# Patient Record
Sex: Female | Born: 1995 | Race: Black or African American | Hispanic: No | Marital: Single | Smoking: Never smoker
Health system: Southern US, Community
[De-identification: ages and names within clinical notes are randomized; demographics above are authoritative.]

## PROBLEM LIST (undated history)

## (undated) DIAGNOSIS — D649 Anemia, unspecified: Secondary | ICD-10-CM

## (undated) DIAGNOSIS — Z87440 Personal history of urinary (tract) infections: Secondary | ICD-10-CM

## (undated) HISTORY — PX: DILATION AND CURETTAGE OF UTERUS: SHX78

## (undated) HISTORY — DX: Personal history of urinary (tract) infections: Z87.440

---
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
## 2013-10-05 ENCOUNTER — Emergency Department (INDEPENDENT_AMBULATORY_CARE_PROVIDER_SITE_OTHER)

## 2013-10-05 ENCOUNTER — Encounter (HOSPITAL_COMMUNITY): Payer: Self-pay | Admitting: Emergency Medicine

## 2013-10-05 ENCOUNTER — Emergency Department (INDEPENDENT_AMBULATORY_CARE_PROVIDER_SITE_OTHER)
Admission: EM | Admit: 2013-10-05 | Discharge: 2013-10-05 | Disposition: A | Discharge status: Home / Self Care | Source: Home / Self Care | Attending: Emergency Medicine | Admitting: Emergency Medicine

## 2013-10-05 DIAGNOSIS — T189XXA Foreign body of alimentary tract, part unspecified, initial encounter: Secondary | ICD-10-CM

## 2013-10-05 NOTE — ED Notes (Signed)
Chart review.

## 2013-10-05 NOTE — ED Provider Notes (Signed)
Chief Complaint:   Chief Complaint  Patient presents with  . Swallowed Foreign Body    History of Present Illness:    Joy White is a 17 year old female who swallowed a tongue stud about an hour before presentation here. She has 2 tongue studs, one on either side of the tongue. The screw came loose while she was eating and she inadvertently swallowed the stud. She replaced it with another stud right away to prevent a hole from closing up. She feels like the stud is lodged in her throat. She is able to swallow saliva, talk, breathe without any difficulty. She denies any abdominal pain, nausea, vomiting, or evidence of GI bleeding.  Review of Systems:  Other than noted above, the patient denies any of the following symptoms: Constitutional:  No fever, chills, fatigue, weight loss or anorexia. Lungs:  No cough or shortness of breath. Heart:  No chest pain, palpitations, syncope or edema.  No cardiac history. Abdomen:  No nausea, vomiting, hematememesis, melena, diarrhea, or hematochezia. GU:  No dysuria, frequency, urgency, or hematuria. Gyn:  No vaginal discharge, itching, abnormal bleeding, dyspareunia, or pelvic pain.  PMFSH:  Past medical history, family history, social history, meds, and allergies were reviewed along with nurse's notes.  No prior abdominal surgeries, past history of GI problems, STDs or GYN problems.  No history of aspirin or NSAID use.  No excessive alcohol intake.  Physical Exam:   Vital signs:  BP 127/80  Pulse 72  Temp(Src) 98.9 F (37.2 C) (Oral)  Resp 16  SpO2 100%  LMP 09/18/2013 Gen:  Alert, oriented, in no distress. ENT: She has 2 tongue studs in place. The pharynx is clear. There is no cervical tenderness. Lungs:  Breath sounds clear and equal bilaterally.  No wheezes, rales or rhonchi. Heart:  Regular rhythm.  No gallops or murmers.   Abdomen:  Soft, nontender, no organomegaly or mass, bowel sounds are normal. Skin:  Clear, warm and dry.  No  rash.  Labs:  No results found for this or any previous visit.   Radiology:  Dg Neck Soft Tissue  10/05/2013   ADDENDUM REPORT: 10/05/2013 18:36  ADDENDUM: Voice Recognition error, CLINICAL DATA should read "Personal lingual adornment"   Electronically Signed   By: Malachy Moan M.D.   On: 10/05/2013 18:36   10/05/2013   CLINICAL DATA:  Swallowed a personal lingual and alignment  EXAM: NECK SOFT TISSUES - 1+ VIEW  COMPARISON:  Concurrently obtained a chest x-ray  FINDINGS: Frontal and lateral views of the soft tissues of the neck demonstrate no radiopaque foreign body. The epiglottis is well-defined. No prevertebral soft tissue swelling. The visualized osseous structures are intact and unremarkable. The upper chest is unremarkable.  IMPRESSION: Negative.  Electronically Signed: By: Malachy Moan M.D. On: 10/05/2013 17:50   Dg Chest 2 View  10/05/2013   CLINICAL DATA:  Swallowed tongue stud 1 hr ago  EXAM: CHEST  2 VIEW  COMPARISON:  None.  FINDINGS: The heart size and mediastinal contours are within normal limits. Both lungs are clear. The visualized skeletal structures are unremarkable. There is a metallic foreign body identified along the inferior margin of the PA radiograph. This may represent ingested tongue stud or could be other body piercing.  IMPRESSION: 1.  No acute cardiopulmonary abnormalities.  2. Metallic density along the inferior margin of the radiograph which may represent the ingested foreign body or could represent other body piercing.   Electronically Signed   By: Signa Kell  M.D.   On: 10/05/2013 17:20   Dg Abd 1 View  10/05/2013   CLINICAL DATA:  Swallowed a tongue piercing  EXAM: ABDOMEN - 1 VIEW  COMPARISON:  None.  FINDINGS: Radiopaque foreign body is noted at the superior aspect of film consistent with the ingested foreign body. No other focal abnormality is seen.  IMPRESSION: Ingested foreign body within the stomach.   Electronically Signed   By: Alcide Clever M.D.    On: 10/05/2013 18:32     Assessment:  The encounter diagnosis was Swallowed foreign body, initial encounter.  The foreign body is now in the stomach and should pass on its own since it is very small. She should be on the lookout for any signs of bleeding or obstruction such as pain, nausea, vomiting, or any evidence of GI bleeding.  Plan:   1.  Meds:  The following meds were prescribed:  There are no discharge medications for this patient.   2.  Patient Education/Counseling:  The patient was given appropriate handouts, self care instructions, and instructed in symptomatic relief.    3.  Follow up:  The patient was told to follow up if no better in 3 to 4 days, if becoming worse in any way, and given some red flag symptoms such as abdominal pain, vomiting, blood in the stool, or melena which would prompt immediate return.  Follow up  At the emergency room if needed.    Reuben Likes, MD 10/05/13 2223

## 2014-10-28 ENCOUNTER — Ambulatory Visit (INDEPENDENT_AMBULATORY_CARE_PROVIDER_SITE_OTHER): Admitting: Family Medicine

## 2014-10-28 VITALS — BP 120/60 | HR 118 | Temp 98.9°F | Resp 16 | Ht 65.0 in | Wt 147.4 lb

## 2014-10-28 DIAGNOSIS — L0390 Cellulitis, unspecified: Secondary | ICD-10-CM

## 2014-10-28 DIAGNOSIS — Z131 Encounter for screening for diabetes mellitus: Secondary | ICD-10-CM

## 2014-10-28 DIAGNOSIS — L0291 Cutaneous abscess, unspecified: Secondary | ICD-10-CM

## 2014-10-28 LAB — POCT GLYCOSYLATED HEMOGLOBIN (HGB A1C): Hemoglobin A1C: 4.9

## 2014-10-28 MED ORDER — DOXYCYCLINE HYCLATE 100 MG PO TABS: 100.0000 mg | ORAL_TABLET | Freq: Two times a day (BID) | ORAL | Status: DC

## 2014-10-28 MED ORDER — HYDROCODONE-ACETAMINOPHEN 5-325 MG PO TABS: 1.0000 | ORAL_TABLET | Freq: Four times a day (QID) | ORAL | Status: DC | PRN

## 2014-10-28 NOTE — Patient Instructions (Addendum)
WOUND CARE Please return in 2 days to have your Packing and wound rechecked . Keep area clean and dry for 24 hours. Do not remove bandage, if applied. . After 24 hours, remove bandage and wash wound gently with mild soap and warm water. Reapply a new bandage after cleaning wound, if directed. . Continue daily cleansing with soap and water until stitches/staples are removed. . Do not apply any ointments or creams to the wound while stitches/staples are in place, as this may cause delayed healing. . Notify the office if you experience any of the following signs of infection: Swelling, redness, pus drainage, streaking, fever >101.0 F . Notify the office if you experience excessive bleeding that does not stop after 15-20 minutes of constant, firm pressure.      Abscess An abscess is an infected area that contains a collection of pus and debris.It can occur in almost any part of the body. An abscess is also known as a furuncle or boil. CAUSES  An abscess occurs when tissue gets infected. This can occur from blockage of oil or sweat glands, infection of hair follicles, or a minor injury to the skin. As the body tries to fight the infection, pus collects in the area and creates pressure under the skin. This pressure causes pain. People with weakened immune systems have difficulty fighting infections and get certain abscesses more often.  SYMPTOMS Usually an abscess develops on the skin and becomes a painful mass that is red, warm, and tender. If the abscess forms under the skin, you may feel a moveable soft area under the skin. Some abscesses break open (rupture) on their own, but most will continue to get worse without care. The infection can spread deeper into the body and eventually into the bloodstream, causing you to feel ill.  DIAGNOSIS  Your caregiver will take your medical history and perform a physical exam. A sample of fluid may also be taken from the abscess to determine what is  causing your infection. TREATMENT  Your caregiver may prescribe antibiotic medicines to fight the infection. However, taking antibiotics alone usually does not cure an abscess. Your caregiver may need to make a small cut (incision) in the abscess to drain the pus. In some cases, gauze is packed into the abscess to reduce pain and to continue draining the area. HOME CARE INSTRUCTIONS   Only take over-the-counter or prescription medicines for pain, discomfort, or fever as directed by your caregiver.  If you were prescribed antibiotics, take them as directed. Finish them even if you start to feel better.  If gauze is used, follow your caregiver's directions for changing the gauze.  To avoid spreading the infection:  Keep your draining abscess covered with a bandage.  Wash your hands well.  Do not share personal care items, towels, or whirlpools with others.  Avoid skin contact with others.  Keep your skin and clothes clean around the abscess.  Keep all follow-up appointments as directed by your caregiver. SEEK MEDICAL CARE IF:   You have increased pain, swelling, redness, fluid drainage, or bleeding.  You have muscle aches, chills, or a general ill feeling.  You have a fever. MAKE SURE YOU:   Understand these instructions.  Will watch your condition.  Will get help right away if you are not doing well or get worse. Document Released: 09/24/2005 Document Revised: 06/15/2012 Document Reviewed: 02/27/2012 [facility] Patient Information 2015 [place]. This information is not intended to replace advice given to you by your health  care provider. Make sure you discuss any questions you have with your health care provider.  

## 2014-10-28 NOTE — Progress Notes (Signed)
      Chief Complaint:  Chief Complaint  Patient presents with  . Cyst    cyst on her buttock x 2   these have been there for a week.      HPI: Levon HedgerBrandi White is a 18 y.o. female who is here for  Bilateral abscess since MOnday She has had chills, tried epson salt without relief. Painful.  Prior history of abscess with resolution with epson salt baths She ahs ahd this in the past and it went away with soaks, this time it has gotten big fairly quickly She can't sit down  No past medical history on file. No past surgical history on file. History   Social History  . Marital Status: Single    Spouse Name: N/A    Number of Children: N/A  . Years of Education: N/A   Social History Main Topics  . Smoking status: Never Smoker   . Smokeless tobacco: None  . Alcohol Use: No  . Drug Use: None  . Sexual Activity: None   Other Topics Concern  . None   Social History Narrative  . None   No family history on file. No Known Allergies Prior to Admission medications   Not on File     ROS: The patient denies night sweats, unintentional weight loss, chest pain, palpitations, wheezing, dyspnea on exertion, nausea, vomiting, abdominal pain, dysuria, hematuria, melena, numbness, weakness, or tingling.  All other systems have been reviewed and were otherwise negative with the exception of those mentioned in the HPI and as above.    PHYSICAL EXAM: Filed Vitals:   10/28/14 1643  BP: 120/60  Pulse: 118  Temp: 98.9 F (37.2 C)  Resp: 16   Filed Vitals:   10/28/14 1643  Height: 5\' 5"  (1.651 m)  Weight: 147 lb 6.4 oz (66.86 kg)   Body mass index is 24.53 kg/(m^2).  General: Alert, no acute distress HEENT:  Normocephalic, atraumatic, oropharynx patent. EOMI, PERRLA Cardiovascular:  Regular rate and rhythm, no rubs murmurs or gallops.  No Carotid bruits, radial pulse intact. No pedal edema.  Respiratory: Clear to auscultation bilaterally.  No wheezes, rales, or rhonchi.  No  cyanosis, no use of accessory musculature GI: No organomegaly, abdomen is soft and non-tender, positive bowel sounds.  No masses. Skin: + cellulitis and also abscess  Neurologic: Facial musculature symmetric. Psychiatric: Patient is appropriate throughout our interaction. Lymphatic: No cervical lymphadenopathy Musculoskeletal: Gait intact.   LABS: Results for orders placed in visit on 10/28/14  POCT GLYCOSYLATED HEMOGLOBIN (HGB A1C)      Result Value Ref Range   Hemoglobin A1C 4.9       EKG/XRAY:   [facility]   ASSESSMENT/PLAN: Encounter Diagnoses  Name Primary?  Marland Kitchen. Abscess and cellulitis Yes  . Screening for diabetes mellitus (DM)    F/u in Am She is not diabetic Wound cx pending Rx norco for pain Rx Doxycycline   Gross sideeffects, risk and benefits, and alternatives of medications d/w patient. Patient is aware that all medications have potential sideeffects and we are unable to predict every sideeffect or drug-drug interaction that may occur.  Hamilton CapriLE, Adilynne Fitzwater PHUONG, DO 10/28/2014 5:51 PM

## 2014-10-28 NOTE — Progress Notes (Signed)
Verbal Consent Obtained. Local anesthesia with 3 cc of 2% lidocaine plain.  11 blade used to incise the 2 lesions, each centrally.  Copious, malodorous purulence expressed. Wounds communicate medially. Irrigated wound with 3 cc of 2% lidocaine and packed with 1/4 inch plain packing.  Cleansed and dressed.

## 2014-10-29 ENCOUNTER — Ambulatory Visit (INDEPENDENT_AMBULATORY_CARE_PROVIDER_SITE_OTHER): Admitting: Physician Assistant

## 2014-10-29 VITALS — BP 110/68 | HR 92 | Temp 98.3°F | Resp 18

## 2014-10-29 DIAGNOSIS — L0390 Cellulitis, unspecified: Secondary | ICD-10-CM

## 2014-10-29 DIAGNOSIS — L0291 Cutaneous abscess, unspecified: Secondary | ICD-10-CM

## 2014-10-29 NOTE — Progress Notes (Signed)
   Subjective:    Patient ID: Joy White, female    DOB: 06/30/1996, 18 y.o.   MRN: [id number]   PCP: No PCP Per Patient  Chief Complaint  Patient presents with  . Wound Check    HPI Presents for wound care s/p I&D of cellulitis/abscess of the buttocks yesterday.  The initial exam revealed "kissing" fluctuant areas of the upper buttocks. Once drained, it became evident that the two areas communicated via a narrow tract across midline, which contains a dilated pore, making this suspicious for a pilonidal abscess.  She's tolerating the antibiotic well, and reports reduced discomfort. No fevers or chills.   Review of Systems     Objective:   Physical Exam  BP 110/68 mmHg  Pulse 92  Temp(Src) 98.3 F (36.8 C) (Oral)  Resp 18  SpO2 100%  LMP 09/30/2014 WDWNBF, A&O x 3. Dressing and packing removed. No additional purulence. Reduction in induration. Tenderness noted primarily inferior to the incision of the LEFT buttock, below the larger wound cavity. Irrigated with 3 cc 1% lidocaine and repacked with 1/2 inch plain packing. Dressed.      Assessment & Plan:  1. Abscess and cellulitis Continue antibiotic, warm compresses and PRN dressing changes. RTC in 2 days, sooner if needed.   Fernande Brashelle S. Charmika Macdonnell, PA-C Physician Assistant-Certified [facility]

## 2014-10-29 NOTE — Patient Instructions (Signed)
Continue the antibiotic as prescribed. Apply a warm compress to the area for 15-20 minutes 2-4 times each day. Change the dressing if it becomes saturated, leaks or falls off.  

## 2014-10-31 ENCOUNTER — Ambulatory Visit (INDEPENDENT_AMBULATORY_CARE_PROVIDER_SITE_OTHER): Admitting: Physician Assistant

## 2014-10-31 VITALS — BP 120/82 | HR 68 | Temp 98.6°F | Resp 16 | Ht 65.0 in | Wt 147.0 lb

## 2014-10-31 DIAGNOSIS — L0291 Cutaneous abscess, unspecified: Secondary | ICD-10-CM

## 2014-10-31 DIAGNOSIS — L0390 Cellulitis, unspecified: Secondary | ICD-10-CM

## 2014-10-31 NOTE — Patient Instructions (Signed)
Continue dressing changes daily. Continue taking the antibiotic. Return for wound recheck on 11/03/14.

## 2014-10-31 NOTE — Progress Notes (Signed)
   Subjective:    Patient ID: Joy White, female    DOB: 09/27/1996, 18 y.o.   MRN: [id number] There are no active problems to display for this patient.  Prior to Admission medications   Medication Sig Start Date End Date Taking? Authorizing Provider  doxycycline (VIBRA-TABS) 100 MG tablet Take 1 tablet (100 mg total) by mouth 2 (two) times daily. 10/28/14  Yes Thao P Le, DO  HYDROcodone-acetaminophen (NORCO) 5-325 MG per tablet Take 1 tablet by mouth every 6 (six) hours as needed for moderate pain. 10/28/14  Yes Thao P Le, DO   No Known Allergies  HPI   Presents for wound care s/p I&D of cellulitis/abscess of the buttocks yesterday. The initial exam revealed "kissing" fluctuant areas of the upper buttocks. Once drained, it became evident that the two areas communicated via a narrow tract across midline, which contains a dilated pore, making this suspicious for a pilonidal abscess. This is her 2nd wound check.  She reports her pain is much improved - she can now sit without pain. She is still taking doxycycline, no side effects. She is changing her dressings daily. She denies fever or chills.    Review of Systems  Constitutional: Negative for fever and chills.  Gastrointestinal: Negative for nausea, vomiting and diarrhea.  Skin: Positive for wound.      Objective:   Physical Exam BP 120/82 mmHg  Pulse 68  Temp(Src) 98.6 F (37 C) (Oral)  Resp 16  Ht 5\' 5"  (1.651 m)  Wt 147 lb (66.679 kg)  BMI 24.46 kg/m2  SpO2 98%  LMP 09/30/2014 Dressing and packing removed. No purulence expressed. Still has 6 cm induration around lesion on left. 4 cm of induration around lesion on right. Right lesion has healed over. No significant tenderness noted. Wound was irrigated with 5 cc 1% lido and repacked with 1/2 inch plain packing. Wound was dressed. Wound care discussed.     Assessment & Plan:  1. Abscess and cellulitis Wound is improving. No purulent material expressed. Wound was  repacked. She will return in 3 days for a recheck. She will continue on doxycycline.   Roswell MinersNicole V. Dyke BrackettBush, PA-C, MHS [facility]  10/31/2014

## 2014-11-01 LAB — WOUND CULTURE: Gram Stain: NONE SEEN

## 2014-11-03 ENCOUNTER — Ambulatory Visit (INDEPENDENT_AMBULATORY_CARE_PROVIDER_SITE_OTHER): Admitting: Physician Assistant

## 2014-11-03 VITALS — BP 112/72 | HR 82 | Temp 98.3°F | Resp 16 | Ht 65.0 in | Wt 143.0 lb

## 2014-11-03 DIAGNOSIS — L0291 Cutaneous abscess, unspecified: Secondary | ICD-10-CM

## 2014-11-03 DIAGNOSIS — L0390 Cellulitis, unspecified: Secondary | ICD-10-CM

## 2014-11-03 NOTE — Patient Instructions (Signed)

## 2014-11-03 NOTE — Progress Notes (Signed)
   Subjective:    Patient ID: Joy HedgerBrandi Hoeffner, female    DOB: 03/20/1996, 18 y.o.   MRN: [id number] There are no active problems to display for this patient.  Prior to Admission medications   Medication Sig Start Date End Date Taking? Authorizing Provider  doxycycline (VIBRA-TABS) 100 MG tablet Take 1 tablet (100 mg total) by mouth 2 (two) times daily. 10/28/14  Yes Thao P Le, DO  HYDROcodone-acetaminophen (NORCO) 5-325 MG per tablet Take 1 tablet by mouth every 6 (six) hours as needed for moderate pain. 10/28/14  Yes Thao P Le, DO   No Known Allergies  Wound Check She was originally treated 3 to 5 days ago. Previous treatment included oral antibiotics. There has been clear discharge from the wound. There is no redness present. There is no swelling present. The pain has no pain. She has no difficulty moving the affected extremity or digit.    She reports her pain is much improved - she can now sit without pain. She is still taking doxycycline, no side effects. She is changing her dressings daily. She denies fever or chills.    Review of Systems  Constitutional: Negative for fever and chills.  Gastrointestinal: Negative for nausea, vomiting and diarrhea.  Skin: Positive for wound.      Objective:   Physical Exam   BP 112/72 mmHg  Pulse 82  Temp(Src) 98.3 F (36.8 C) (Oral)  Resp 16  Ht 5\' 5"  (1.651 m)  Wt 143 lb (64.864 kg)  BMI 23.80 kg/m2  SpO2 99%  LMP 10/31/2014  Dressing and packing removed. No purulence expressed. THe lesion on the left has healed, and still has 2 cm induration. No of induration around lesion on right. Right lesion has healed over. No significant tenderness noted. Wound care discussed.     Assessment & Plan:  1. Abscess and cellulitis Wound continues to improve. Patient is asymptomatic. She will continue on doxycycline. No necessity in returning for this problem.    Deliah BostonMichael Honestii Marton PA-C [facility]  11/03/2014

## 2014-11-03 NOTE — Progress Notes (Signed)
I was directly involved with the patient's care and agree with the physical, diagnosis and treatment plan.  

## 2014-11-04 NOTE — Progress Notes (Signed)
I was directly involved with the patient's care and agree with the physical, diagnosis and treatment plan.  

## 2014-12-09 ENCOUNTER — Ambulatory Visit

## 2014-12-14 ENCOUNTER — Ambulatory Visit (INDEPENDENT_AMBULATORY_CARE_PROVIDER_SITE_OTHER): Admitting: Family Medicine

## 2014-12-14 VITALS — BP 122/80 | HR 99 | Temp 98.8°F | Resp 18 | Ht 64.0 in | Wt 140.6 lb

## 2014-12-14 DIAGNOSIS — L03317 Cellulitis of buttock: Secondary | ICD-10-CM

## 2014-12-14 MED ORDER — HYDROCODONE-ACETAMINOPHEN 5-325 MG PO TABS: 1.0000 | ORAL_TABLET | Freq: Four times a day (QID) | ORAL | Status: DC | PRN

## 2014-12-14 MED ORDER — DOXYCYCLINE HYCLATE 100 MG PO TABS: 100.0000 mg | ORAL_TABLET | Freq: Two times a day (BID) | ORAL | Status: DC

## 2014-12-14 NOTE — Progress Notes (Signed)
Verbal Consent Obtained. Local anesthesia with 5 cc of 2% lidocaine with epi.  11 blade used to incise the lesion centrally.  Significant purulence expressed. Wound packed with 1/4 inch plain packing.  Cleansed and dressed.  Donnajean Lopesodd M. Annella Prowell, PA-C Physician Assistant-Certified [facility]  12/14/2014 10:37 AM

## 2014-12-14 NOTE — Progress Notes (Signed)
Subjective:  This chart was scribed for Meredith StaggersJeffrey Farren Nelles, MD by Haywood PaoNadim Abu Hashem, ED Scribe at [facility].The patient was seen in exam room 03 and the patient's care was started at 9:39 AM.   Patient ID: Joy White, female    DOB: 11/10/1996, 18 y.o.   MRN: [id number] Chief Complaint  Patient presents with  . Cyst    On her tailbone. x2 days   HPI HPI Comments: Joy White is a 18 y.o. female [facility] of a gradually woresening cyst located on her buttck onset 2 days ago. Pt states the cyst was sore, but denies drainage and fever. Her first cyst was in July of this year and it resolved on its own. Pt was seen Oct 31st for cellulitis and an abscess on her buttock. She was treated with an I&D and doxycycline. Suspicious for a pilonidal abscess. Required packing and improved on November 6th which was her last visit. This is her 3rd incident in the same area.  There are no active problems to display for this patient.  History reviewed. No pertinent past medical history. History reviewed. No pertinent past surgical history. No Known Allergies Prior to Admission medications   Medication Sig Start Date End Date Taking? Authorizing Provider  doxycycline (VIBRA-TABS) 100 MG tablet Take 1 tablet (100 mg total) by mouth 2 (two) times daily. Patient not taking: Reported on 12/14/2014 10/28/14   Thao P Le, DO  HYDROcodone-acetaminophen (NORCO) 5-325 MG per tablet Take 1 tablet by mouth every 6 (six) hours as needed for moderate pain. Patient not taking: Reported on 12/14/2014 10/28/14   Lenell Antuhao P Le, DO   History   Social History  . Marital Status: Single    Spouse Name: N/A    Number of Children: N/A  . Years of Education: N/A   Occupational History  . Not on file.   Social History Main Topics  . Smoking status: Never Smoker   . Smokeless tobacco: Not on file  . Alcohol Use: No  . Drug Use: Not on file  . Sexual Activity: Not on file    Other Topics Concern  . Not on file   Social History Narrative   Review of Systems  Skin: Positive for wound.      Objective:  BP 122/80 mmHg  Pulse 99  Temp(Src) 98.8 F (37.1 C) (Oral)  Resp 18  Ht 5\' 4"  (1.626 m)  Wt 140 lb 9.6 oz (63.776 kg)  BMI 24.12 kg/m2  SpO2 100%  LMP 11/24/2014  Physical Exam  Constitutional: She is oriented to person, place, and time. She appears well-developed and well-nourished. No distress.  HENT:  Head: Normocephalic and atraumatic.  Eyes: EOM are normal.  Neck: Normal range of motion.  Cardiovascular: Normal rate.   Pulmonary/Chest: Effort normal.  Neurological: She is alert and oriented to person, place, and time.  Skin: Skin is warm and dry.  Top of the cleft and left buttock. Slight tenderness of the apex left buttock. Near location of prior scar an Induration.  4 cm  well healed scar on the right buttcok is without induration  Psychiatric: She has a normal mood and affect. Her behavior is normal.  Nursing note and vitals reviewed.     Assessment & Plan:   Joy White is a 18 y.o. female Cellulitis of buttock, left - Plan: doxycycline (VIBRA-TABS) 100 MG tablet, HYDROcodone-acetaminophen (NORCO) 5-325 MG per tablet, Wound culture - recurrent vs. small persistent area  after last I and D.  Repeat I and D done today. Restart doxycycline, hydrocodone if needed for pain, and recheck in 2 days for wound care. rtc precautions.   If this recurs again. May need to see general surgeon.   Meds ordered this encounter  Medications  . doxycycline (VIBRA-TABS) 100 MG tablet    Sig: Take 1 tablet (100 mg total) by mouth 2 (two) times daily.    Dispense:  20 tablet    Refill:  0  . HYDROcodone-acetaminophen (NORCO) 5-325 MG per tablet    Sig: Take 1 tablet by mouth every 6 (six) hours as needed for moderate pain.    Dispense:  20 tablet    Refill:  0   Patient Instructions  Cellulitis Cellulitis is an infection of the skin and the  tissue beneath it. The infected area is usually red and tender. Cellulitis occurs most often in the arms and lower legs.  CAUSES  Cellulitis is caused by bacteria that enter the skin through cracks or cuts in the skin. The most common types of bacteria that cause cellulitis are staphylococci and streptococci. SIGNS AND SYMPTOMS   Redness and warmth.  Swelling.  Tenderness or pain.  Fever. DIAGNOSIS  Your health care provider can usually determine what is wrong based on a physical exam. Blood tests may also be done. TREATMENT  Treatment usually involves taking an antibiotic medicine. HOME CARE INSTRUCTIONS   Take your antibiotic medicine as directed by your health care provider. Finish the antibiotic even if you start to feel better.  Keep the infected arm or leg elevated to reduce swelling.  Apply a warm cloth to the affected area up to 4 times per day to relieve pain.  Take medicines only as directed by your health care provider.  Keep all follow-up visits as directed by your health care provider. SEEK MEDICAL CARE IF:   You notice red streaks coming from the infected area.  Your red area gets larger or turns dark in color.  Your bone or joint underneath the infected area becomes painful after the skin has healed.  Your infection returns in the same area or another area.  You notice a swollen bump in the infected area.  You develop new symptoms.  You have a fever. SEEK IMMEDIATE MEDICAL CARE IF:   You feel very sleepy.  You develop vomiting or diarrhea.  You have a general ill feeling (malaise) with muscle aches and pains. MAKE SURE YOU:   Understand these instructions.  Will watch your condition.  Will get help right away if you are not doing well or get worse. Document Released: 09/24/2005 Document Revised: 05/01/2014 Document Reviewed: 03/01/2012 [facility] Patient Information 2015 [place]. This information is not intended to replace advice given to  you by your health care provider. Make sure you discuss any questions you have with your health care provider.     I personally performed the services described in this documentation, which was scribed in my presence. The recorded information has been reviewed and considered, and addended by me as needed.

## 2014-12-14 NOTE — Patient Instructions (Signed)

## 2014-12-16 ENCOUNTER — Ambulatory Visit (INDEPENDENT_AMBULATORY_CARE_PROVIDER_SITE_OTHER): Admitting: Physician Assistant

## 2014-12-16 VITALS — BP 106/58 | HR 89 | Temp 98.3°F | Resp 16 | Ht 64.5 in | Wt 141.4 lb

## 2014-12-16 DIAGNOSIS — L03317 Cellulitis of buttock: Secondary | ICD-10-CM

## 2014-12-16 LAB — WOUND CULTURE: Gram Stain: NONE SEEN

## 2014-12-16 NOTE — Progress Notes (Signed)
   Subjective:    Patient ID: Joy White, female    DOB: 05/20/1996, 18 y.o.   MRN: [id number]  HPI  Pt presents to clinic for wound recheck.  She has been tolerating the abx ok.  The area is still painful but it is getting better.  She has noted quite a bit of drainage from the area.  Review of Systems  Constitutional: Negative for fever and chills.  Gastrointestinal: Negative for nausea.  Skin: Positive for wound.       Objective:   Physical Exam  Constitutional: She is oriented to person, place, and time. She appears well-developed and well-nourished.  BP 106/58 mmHg  Pulse 89  Temp(Src) 98.3 F (36.8 C) (Oral)  Resp 16  Ht 5' 4.5" (1.638 m)  Wt 141 lb 6.4 oz (64.139 kg)  BMI 23.91 kg/m2  SpO2 100%  LMP 11/24/2014   HENT:  Head: Normocephalic and atraumatic.  Right Ear: External ear normal.  Left Ear: External ear normal.  Pulmonary/Chest: Effort normal.  Neurological: She is alert and oriented to person, place, and time.  Skin: Skin is warm and dry.  Drsg and packing removed.  Small amount of necrotic tissue on wound edge but it is easily removed with wiping on the wound edges.  There is not additional purulence expressed from the wound.    Psychiatric: She has a normal mood and affect. Her behavior is normal. Judgment and thought content normal.   Procedure:  Wound irrigated with 1% lido.  Wound cavity with granulation tissue - wound repacked with 1/4 in plain packing.  Drsg placed,     Assessment & Plan:  Cellulitis of buttock, left   Continue abx.  Continue daily drsg change.  Recheck Monday prior to leaving time for the holidays.  Benny LennertSarah Consuelo Thayne PA-C  [facility] 12/16/2014 11:34 AM

## 2015-01-28 ENCOUNTER — Ambulatory Visit (INDEPENDENT_AMBULATORY_CARE_PROVIDER_SITE_OTHER): Admitting: Physician Assistant

## 2015-01-28 VITALS — BP 108/64 | HR 94 | Temp 98.5°F | Resp 18 | Ht 65.25 in | Wt 144.6 lb

## 2015-01-28 DIAGNOSIS — Z792 Long term (current) use of antibiotics: Secondary | ICD-10-CM

## 2015-01-28 DIAGNOSIS — L0501 Pilonidal cyst with abscess: Secondary | ICD-10-CM

## 2015-01-28 DIAGNOSIS — L03317 Cellulitis of buttock: Secondary | ICD-10-CM

## 2015-01-28 LAB — POCT URINE PREGNANCY: Preg Test, Ur: NEGATIVE

## 2015-01-28 MED ORDER — DOXYCYCLINE HYCLATE 100 MG PO CAPS: 100.0000 mg | ORAL_CAPSULE | Freq: Two times a day (BID) | ORAL | Status: DC

## 2015-01-28 MED ORDER — IBUPROFEN 600 MG PO TABS: 600.0000 mg | ORAL_TABLET | Freq: Three times a day (TID) | ORAL | Status: DC | PRN

## 2015-01-28 NOTE — Progress Notes (Signed)
   01/28/2015 at 11:46 AM  Levon HedgerBrandi Stagner / DOB: 03/06/1996 / MRN: [id number]  The patient  does not have a problem list on file.  SUBJECTIVE  Chief compalaint: Abscess and Prescriptions   History of present illness: [name] is 19 y.o. well appearing female presenting for the evaluation of a left superior buttock abscess that started approximately 6 days ago. She report it is increasingly difficult to sit, and stat  She denies fever, chills, diaphoresis, and nausea.  She has tried nothing for this problem. This is third time she has come for I&D for the same problem.  Chart review reveal negative staff cultures x 2.    She  has no past medical history on file.    She currently has no medications in their medication list.  [name] has No Known Allergies. She  reports that she has never smoked. She does not have any smokeless tobacco history on file. She reports that she does not drink alcohol. She  has no sexual activity history on file.  The patient  has no past surgical history on file.  Her family history is not on file.  Review of Systems  Constitutional: Negative.   HENT: Negative.   Eyes: Negative.   Respiratory: Negative.   Cardiovascular: Negative.   Gastrointestinal: Negative.   Skin: Negative.     OBJECTIVE   height is 5' 5.25" (1.657 m) and weight is 144 lb 10.1 oz (65.604 kg). Her oral temperature is 98.5 F (36.9 C). Her blood pressure is 108/64 and her pulse is 94. Her respiration is 18 and oxygen saturation is 100%.  The patient's body mass index is 23.89 kg/(m^2).  Physical Exam  Constitutional: Vital signs are normal. She appears well-developed and well-nourished.  Skin: Skin is warm and dry.      Procedure:  Verbal consent was obtained.  Patient was anesthetized with 2% lidocaine without epi.  A one cm incision was made over the area of fluctuance and copious purulence was expressed. Wound was packed and clean dressing was placed.  Patient  tolerated procedure without difficulty.   Results for orders placed or performed in visit on 01/28/15 (from the past 24 hour(s))  POCT urine pregnancy     Status: None   Collection Time: 01/28/15 11:42 AM  Result Value Ref Range   Preg Test, Ur Negative     ASSESSMENT & PLAN  [name] was seen today for abscess and prescriptions.  Diagnoses and associated orders for this visit:  Cellulitis of buttock, left - doxycycline (VIBRAMYCIN) 100 MG capsule; Take 1 capsule (100 mg total) by mouth 2 (two) times daily. - Discontinue: ibuprofen (ADVIL,MOTRIN) 600 MG tablet; Take 1 tablet (600 mg total) by mouth every 8 (eight) hours as needed. - Wound culture - ibuprofen (ADVIL,MOTRIN) 600 MG tablet; Take 1 tablet (600 mg total) by mouth every 8 (eight) hours as needed.  Pilonidal cyst with abscess: Highly likely given the continual reemergence of the abscess in the same location.  There are also pores on the midline and culture times 2 have all be negative for a definitive organism.   - Ambulatory referral to General Surgery  Need for prophylactic antibiotic - POCT urine pregnancy    The patient was instructed to to call or comeback to clinic as needed, or should symptoms warrant.  Deliah BostonMichael Clark, MHS, PA-C [facility] 01/28/2015 11:46 AM

## 2015-01-30 ENCOUNTER — Ambulatory Visit (INDEPENDENT_AMBULATORY_CARE_PROVIDER_SITE_OTHER): Admitting: Physician Assistant

## 2015-01-30 VITALS — BP 118/68 | HR 66 | Temp 98.2°F | Resp 18

## 2015-01-30 DIAGNOSIS — L0501 Pilonidal cyst with abscess: Secondary | ICD-10-CM

## 2015-01-30 NOTE — Progress Notes (Signed)
   01/30/2015 at 1:40 PM  Joy HedgerBrandi White / DOB: 09/06/1996 / MRN: [id number]  The patient  does not have a problem list on file.  SUBJECTIVE  Chief compalaint: Wound Check   History of present illness: [name] is 19 y.o. well appearing female presenting for wound care status post incision and drainage two days previous. She reports some soreness, but denies exquisite tenderness, fever, nausea and presyncope.. She has tried doxycycline along with ibuprofen with good relief.  She  has no past medical history on file.    She has a current medication list which includes the following prescription(s): doxycycline and ibuprofen.  [name] has No Known Allergies. She  reports that she has never smoked. She does not have any smokeless tobacco history on file. She reports that she does not drink alcohol. She  has no sexual activity history on file.  The patient  has no past surgical history on file.  Her family history is not on file.  Review of Systems  Constitutional: Negative.   Skin: Positive for rash.    OBJECTIVE  Her  oral temperature is 98.2 F (36.8 C). Her blood pressure is 118/68 and her pulse is 66. Her respiration is 18 and oxygen saturation is 98%.  The patient's body mass index is unknown because there is no weight on file.  Physical Exam  Constitutional: Vital signs are normal. She appears well-developed and well-nourished.  Skin: Skin is warm and dry.      Wound culture 01/28/15 Gram Stain AbundantP   Gram Stain WBC present-predominately PMNP   Gram Stain No Squamous Epithelial Cells SeenP   Gram Stain Abundant GRAM POSITIVE COCCI IN PAIRS AND CHAINSP   Preliminary Report Culture reincubated for better growthP       ASSESSMENT & PLAN  [name] was seen today for wound check.  Diagnoses and associated orders for this visit:  Pilonidal cyst with abscess: Continue current plan of care via ABx and Ibuprofen. Patient has appointment with general  surgeon today at 3 pm.  Advised patient to return in 48 hours if needed, and per surgeon's instructions.  Fast pass provided.      The patient was instructed to to call or comeback to clinic as needed, or should symptoms warrant.  Deliah BostonMichael Lousie Calico, MHS, PA-C [facility] 01/30/2015 1:40 PM

## 2015-01-31 LAB — WOUND CULTURE: Gram Stain: NONE SEEN

## 2017-11-28 DIAGNOSIS — N12 Tubulo-interstitial nephritis, not specified as acute or chronic: Secondary | ICD-10-CM

## 2017-11-28 HISTORY — DX: Tubulo-interstitial nephritis, not specified as acute or chronic: N12

## 2017-12-01 ENCOUNTER — Ambulatory Visit: Admitting: Physician Assistant

## 2017-12-02 ENCOUNTER — Emergency Department (HOSPITAL_COMMUNITY)

## 2017-12-02 ENCOUNTER — Other Ambulatory Visit: Payer: Self-pay

## 2017-12-02 ENCOUNTER — Encounter: Payer: Self-pay | Admitting: Physician Assistant

## 2017-12-02 ENCOUNTER — Emergency Department (HOSPITAL_COMMUNITY)
Admission: EM | Admit: 2017-12-02 | Discharge: 2017-12-02 | Disposition: A | Discharge status: Home / Self Care | Attending: Emergency Medicine | Admitting: Emergency Medicine

## 2017-12-02 ENCOUNTER — Ambulatory Visit (INDEPENDENT_AMBULATORY_CARE_PROVIDER_SITE_OTHER): Payer: TRICARE For Life (TFL) | Admitting: Physician Assistant

## 2017-12-02 ENCOUNTER — Encounter (HOSPITAL_COMMUNITY): Payer: Self-pay | Admitting: Emergency Medicine

## 2017-12-02 VITALS — BP 114/60 | HR 115 | Temp 98.6°F | Resp 16 | Ht 65.0 in | Wt 134.4 lb

## 2017-12-02 DIAGNOSIS — R1031 Right lower quadrant pain: Secondary | ICD-10-CM

## 2017-12-02 DIAGNOSIS — N10 Acute tubulo-interstitial nephritis: Secondary | ICD-10-CM | POA: Insufficient documentation

## 2017-12-02 DIAGNOSIS — Z79899 Other long term (current) drug therapy: Secondary | ICD-10-CM | POA: Diagnosis not present

## 2017-12-02 DIAGNOSIS — R000 Tachycardia, unspecified: Secondary | ICD-10-CM

## 2017-12-02 DIAGNOSIS — D72829 Elevated white blood cell count, unspecified: Secondary | ICD-10-CM

## 2017-12-02 DIAGNOSIS — R1084 Generalized abdominal pain: Secondary | ICD-10-CM

## 2017-12-02 DIAGNOSIS — N12 Tubulo-interstitial nephritis, not specified as acute or chronic: Secondary | ICD-10-CM

## 2017-12-02 LAB — URINALYSIS, ROUTINE W REFLEX MICROSCOPIC
Bilirubin Urine: NEGATIVE
Glucose, UA: NEGATIVE mg/dL
Ketones, ur: 5 mg/dL — AB
Nitrite: NEGATIVE
PH: 6 (ref 5.0–8.0)
Protein, ur: 100 mg/dL — AB
SPECIFIC GRAVITY, URINE: 1.012 (ref 1.005–1.030)

## 2017-12-02 LAB — POCT URINE PREGNANCY: Preg Test, Ur: NEGATIVE

## 2017-12-02 LAB — BASIC METABOLIC PANEL
ANION GAP: 11 (ref 5–15)
BUN: 7 mg/dL (ref 6–20)
CALCIUM: 9.3 mg/dL (ref 8.9–10.3)
CO2: 23 mmol/L (ref 22–32)
CREATININE: 0.99 mg/dL (ref 0.44–1.00)
Chloride: 102 mmol/L (ref 101–111)
GFR calc Af Amer: >60 mL/min (ref >60)
Glucose, Bld: 122 mg/dL — ABNORMAL HIGH (ref 65–99)
Potassium: 3.4 mmol/L — ABNORMAL LOW (ref 3.5–5.1)
Sodium: 136 mmol/L (ref 135–145)

## 2017-12-02 LAB — CBC
HCT: 40.9 % (ref 36.0–46.0)
HEMOGLOBIN: 13.7 g/dL (ref 12.0–15.0)
MCH: 29.2 pg (ref 26.0–34.0)
MCHC: 33.5 g/dL (ref 30.0–36.0)
MCV: 87.2 fL (ref 78.0–100.0)
Platelets: 234 10*3/uL (ref 150–400)
RBC: 4.69 MIL/uL (ref 3.87–5.11)
RDW: 17.4 % — ABNORMAL HIGH (ref 11.5–15.5)
WBC: 31.5 10*3/uL — AB (ref 4.0–10.5)

## 2017-12-02 LAB — POCT CBC
Granulocyte percent: 90.2 %G — AB (ref 37–80)
HCT, POC: 40.7 % (ref 37.7–47.9)
Hemoglobin: 13.4 g/dL (ref 12.2–16.2)
Lymph, poc: 2 (ref 0.6–3.4)
MCH, POC: 29.3 pg (ref 27–31.2)
MCHC: 33 g/dL (ref 31.8–35.4)
MCV: 88.7 fL (ref 80–97)
MID (cbc): 0.9 (ref 0–0.9)
MPV: 7.6 fL (ref 0–99.8)
POC Granulocyte: 27 — AB (ref 2–6.9)
POC LYMPH PERCENT: 6.7 %L — AB (ref 10–50)
POC MID %: 3.1 %M (ref 0–12)
Platelet Count, POC: 237 10*3/uL (ref 142–424)
RBC: 4.59 M/uL (ref 4.04–5.48)
RDW, POC: 18.6 %
WBC: 29.9 10*3/uL — AB (ref 4.6–10.2)

## 2017-12-02 MED ORDER — IOPAMIDOL (ISOVUE-300) INJECTION 61%
INTRAVENOUS | Status: AC
  Filled 2017-12-02: qty 100

## 2017-12-02 MED ORDER — SODIUM CHLORIDE 0.9 % IV BOLUS (SEPSIS)
1000.0000 mL | Freq: Once | INTRAVENOUS | Status: AC
  Administered 2017-12-02: 1000 mL via INTRAVENOUS

## 2017-12-02 MED ORDER — HYDROCODONE-ACETAMINOPHEN 5-325 MG PO TABS: 1.0000 | ORAL_TABLET | ORAL | 0 refills | Status: DC | PRN

## 2017-12-02 MED ORDER — DEXTROSE 5 % IV SOLN
1.0000 g | Freq: Once | INTRAVENOUS | Status: AC
  Administered 2017-12-02: 1 g via INTRAVENOUS
  Filled 2017-12-02: qty 10

## 2017-12-02 MED ORDER — IOPAMIDOL (ISOVUE-300) INJECTION 61%
100.0000 mL | Freq: Once | INTRAVENOUS | Status: AC | PRN
  Administered 2017-12-02: 100 mL via INTRAVENOUS

## 2017-12-02 MED ORDER — ONDANSETRON 8 MG PO TBDP: 8.0000 mg | ORAL_TABLET | Freq: Three times a day (TID) | ORAL | 0 refills | Status: DC | PRN

## 2017-12-02 MED ORDER — MORPHINE SULFATE (PF) 4 MG/ML IV SOLN
4.0000 mg | Freq: Once | INTRAVENOUS | Status: AC
  Administered 2017-12-02: 4 mg via INTRAVENOUS
  Filled 2017-12-02: qty 1

## 2017-12-02 MED ORDER — CEPHALEXIN 500 MG PO CAPS: 500.0000 mg | ORAL_CAPSULE | Freq: Three times a day (TID) | ORAL | 0 refills | Status: DC

## 2017-12-02 NOTE — Progress Notes (Signed)
Shay   

## 2017-12-02 NOTE — ED Notes (Signed)
Bed: WA01 Expected date:  Expected time:  Means of arrival:  Comments: 

## 2017-12-02 NOTE — ED Triage Notes (Signed)
Patient c/o RLQ abdominal pain x3 days. [facility] to come to ED to r/o appendicitis due to elevated WBC in lab work today. Denies N/V/D. Ambulatory.

## 2017-12-02 NOTE — Patient Instructions (Addendum)
  Go directly to the emergency department. Your white blood cell count is extremely high. I am concerned about perforated appendix - this needs to be evaluated immediately.   Thank you for coming in today. I hope you feel we met your needs.  Feel free to call PCP if you have any questions or further requests.  Please consider signing up for MyChart if you do not already have it, as this is a great way to communicate with me.  Best,  Whitney McVey, PA-C  IF you received an x-ray today, you will receive an invoice from [facility]. Please contact [facility] at [phone] with questions or concerns regarding your invoice.   IF you received labwork today, you will receive an invoice from [facility]. Please contact [facility] at [phone] with questions or concerns regarding your invoice.   Our billing staff will not be able to assist you with questions regarding bills from these companies.  You will be contacted with the lab results as soon as they are available. The fastest way to get your results is to activate your My Chart account. Instructions are located on the last page of this paperwork. If you have not heard from [place] regarding the results in 2 weeks, please contact this office.

## 2017-12-02 NOTE — Progress Notes (Signed)
Levon HedgerBrandi Meller  MRN: [id number] DOB: 02/23/1996  PCP: Patient, No Pcp Per  Subjective:  Pt is a 21 year old female presents to clinic for abdominal pain x 4 days. Pain is located in her RLQ. Sometimes pain is "sharp" and radiates to her back.  Pain is constant. Pain is worse with walking and moving. She cannot find a comfortable position.  Endorses chills and nausea.  Denies fever, vomiting, diarrhea, vignal symptoms. She endorses some symptoms of UTI a few days ago, however seems to be resolving.   Review of Systems  Constitutional: Positive for chills. Negative for diaphoresis, fatigue and fever.  Gastrointestinal: Positive for abdominal pain and nausea. Negative for diarrhea and vomiting.  Musculoskeletal: Positive for back pain.    There are no active problems to display for this patient.   No current outpatient medications on file prior to visit.   No current facility-administered medications on file prior to visit.     No Known Allergies   Objective:  BP 114/60   Pulse (!) 115   Temp 98.6 F (37 C) (Oral)   Resp 16   Ht 5\' 5"  (1.651 m)   Wt 134 lb 6.4 oz (61 kg)   LMP 11/08/2017   SpO2 98%   BMI 22.37 kg/m   Physical Exam  Constitutional: She is oriented to person, place, and time and well-developed, well-nourished, and in no distress. She appears to not be writhing in pain.  Non-toxic appearance. She does not have a sickly appearance. No distress.  Cardiovascular: Regular rhythm and normal heart sounds. Tachycardia present.  Abdominal: Soft. Normal appearance. There is tenderness in the periumbilical area. There is tenderness at McBurney's point. There is no rigidity and no guarding.  Periumbilical palpation elicits more pain than McBurney's point.  Neurological: She is alert and oriented to person, place, and time. GCS score is 15.  Skin: Skin is warm and dry.  Psychiatric: Mood, memory, affect and judgment normal.    Results for orders placed or  performed in visit on 12/02/17  POCT CBC  Result Value Ref Range   WBC 29.9 (A) 4.6 - 10.2 K/uL   Lymph, poc 2.0 0.6 - 3.4   POC LYMPH PERCENT 6.7 (A) 10 - 50 %L   MID (cbc) 0.9 0 - 0.9   POC MID % 3.1 0 - 12 %M   POC Granulocyte 27.0 (A) 2 - 6.9   Granulocyte percent 90.2 (A) 37 - 80 %G   RBC 4.59 4.04 - 5.48 M/uL   Hemoglobin 13.4 12.2 - 16.2 g/dL   HCT, POC 40.940.7 81.137.7 - 47.9 %   MCV 88.7 80 - 97 fL   MCH, POC 29.3 27 - 31.2 pg   MCHC 33.0 31.8 - 35.4 g/dL   RDW, POC 91.418.6 %   Platelet Count, POC 237 142 - 424 K/uL   MPV 7.6 0 - 99.8 fL    Assessment and Plan :  1. Right lower quadrant abdominal pain 2. Tachycardia 3. Leukocytosis, unspecified type - POCT CBC - POCT urine pregnancy - Pt presents with 4 days of worsening RLQ pain.  WBC count of 29.9, tachycardia and periumbilical pain on PE. Strongly suspect appendicitis, concern for perforation. She is not acutely ill and I feel she is capable of transporting herself to the emergency department. Discussed findings and concerns with pt. Advised her to proceed directly to ED. She understands and agrees. Spoke with [facility] charge nurse to alert of her arrival.  Whitney Nevaeh Korte, PA-C  [facility] 12/02/2017 9:49 AM

## 2017-12-02 NOTE — ED Triage Notes (Signed)
A P.A. From [facility] calls before her arrival to tell us they are sending her here to be evaluated for 4 days of RLQ abd. Pain with a leukocytosis of 29.9k.

## 2017-12-04 LAB — URINE CULTURE

## 2017-12-05 NOTE — ED Provider Notes (Signed)
[facility]-EMERGENCY DEPT Provider Note   CSN: [id number] Arrival date & time: 12/02/17  1040     History   Chief Complaint Chief Complaint  Patient presents with  . Abdominal Pain    HPI Joy White is a 21 y.o. female.  HPI 21 year old female sent to the emergency department from urgent care for possible appendicitis.  Patient reports right-sided abdominal pain as well as urinary frequency.  She states she had urinary symptoms last week that improved with cranberry juice but was never treated with antibiotics.  She continues to have urinary frequency and some dysuria at this time.  She reports some right flank and right-sided abdominal pain.  She reports nausea without vomiting.  Denies diarrhea.  History reviewed. No pertinent past medical history.  There are no active problems to display for this patient.   History reviewed. No pertinent surgical history.  OB History    No data available       Home Medications    Prior to Admission medications   Medication Sig Start Date End Date Taking? Authorizing Provider  acetaminophen (TYLENOL) 500 MG tablet Take 500 mg by mouth every 6 (six) hours as needed for mild pain, moderate pain, fever or headache.   Yes [provider]  cephALEXin (KEFLEX) 500 MG capsule Take 1 capsule (500 mg total) by mouth 3 (three) times daily. 12/02/17   Azalia Bilisampos, Luretta Everly, MD  HYDROcodone-acetaminophen (NORCO/VICODIN) 5-325 MG tablet Take 1 tablet by mouth every 4 (four) hours as needed for moderate pain. 12/02/17   Azalia Bilisampos, Luciana Cammarata, MD  ondansetron (ZOFRAN ODT) 8 MG disintegrating tablet Take 1 tablet (8 mg total) by mouth every 8 (eight) hours as needed for nausea or vomiting. 12/02/17   Azalia Bilisampos, Snow Peoples, MD    Family History No family history on file.  Social History Social History   Tobacco Use  . Smoking status: Never Smoker  . Smokeless tobacco: Never Used  Substance Use Topics  . Alcohol use: No  . Drug use:  Not on file     Allergies   Patient has no known allergies.   Review of Systems Review of Systems  All other systems reviewed and are negative.    Physical Exam Updated Vital Signs BP 116/71 (BP Location: Left Arm)   Pulse (!) 109   Temp 98.1 F (36.7 C) (Oral)   Resp 16   LMP 11/08/2017   SpO2 100%   Physical Exam  Constitutional: She is oriented to person, place, and time. She appears well-developed and well-nourished. No distress.  HENT:  Head: Normocephalic and atraumatic.  Eyes: EOM are normal.  Neck: Normal range of motion.  Cardiovascular: Normal rate, regular rhythm and normal heart sounds.  Pulmonary/Chest: Effort normal and breath sounds normal.  Abdominal: Soft. She exhibits no distension.  Mild right-sided abdominal tenderness.  Musculoskeletal: Normal range of motion.  Neurological: She is alert and oriented to person, place, and time.  Skin: Skin is warm and dry.  Psychiatric: She has a normal mood and affect. Judgment normal.  Nursing note and vitals reviewed.    ED Treatments / Results  Labs (all labs ordered are listed, but only abnormal results are displayed) Labs Reviewed  URINE CULTURE - Abnormal; Notable for the following components:      Result Value        All other components within normal limits  BASIC METABOLIC PANEL - Abnormal; Notable for the following components:   Potassium 3.4 (*)    Glucose, Bld  122 (*)    All other components within normal limits  URINALYSIS, ROUTINE W REFLEX MICROSCOPIC - Abnormal; Notable for the following components:   APPearance CLOUDY (*)    Hgb urine dipstick LARGE (*)    Ketones, ur 5 (*)    Protein, ur 100 (*)    Leukocytes, UA LARGE (*)    Bacteria, UA RARE (*)    Squamous Epithelial / LPF 0-5 (*)    All other components within normal limits  CBC - Abnormal; Notable for the following components:   WBC 31.5 (*)    RDW 17.4 (*)    All other components within normal limits    EKG  EKG  Interpretation None       Radiology  IMPRESSION: 1. Ascending right urinary tract infection with acute Right Pyelonephritis. Right pararenal inflammation, but no renal abscess or complicating features. 2. Trace pleural fluid in the right costophrenic angle with mild right lung base atelectasis, likely reactive due to #1. 3. Small volume simple appearing pelvic free fluid which is probably physiologic (with physiologic collapsing cyst noted in the right ovary), or less likely related to #1. 4. Normal appendix.  No abnormal bowel.   Electronically Signed   By: Odessa FlemingH  Hall M.D.   On: 12/02/2017 12:53  Procedures Procedures (including critical care time)  Medications Ordered in ED Medications  morphine 4 MG/ML injection 4 mg (4 mg Intravenous Given 12/02/17 1139)  sodium chloride 0.9 % bolus 1,000 mL (0 mLs Intravenous Stopped 12/02/17 1249)  iopamidol (ISOVUE-300) 61 % injection 100 mL (100 mLs Intravenous Contrast Given 12/02/17 1227)  cefTRIAXone (ROCEPHIN) 1 g in dextrose 5 % 50 mL IVPB (0 g Intravenous Stopped 12/02/17 1503)     Initial Impression / Assessment and Plan / ED Course  I have reviewed the triage vital signs and the nursing notes.  Pertinent labs & imaging results that were available during my care of the patient were reviewed by me and considered in my medical decision making (see chart for details).     Right-sided pyelonephritis.  IV antibiotics now.  Home with antibiotics.  No signs suggest appendicitis.  Patient feels better.  Discharged home in good condition.  Strict return precautions given  Final Clinical Impressions(s) / ED Diagnoses   Final diagnoses:  Pyelonephritis    ED Discharge Orders        Ordered    cephALEXin (KEFLEX) 500 MG capsule  3 times daily     12/02/17 1532    ondansetron (ZOFRAN ODT) 8 MG disintegrating tablet  Every 8 hours PRN     12/02/17 1532    HYDROcodone-acetaminophen (NORCO/VICODIN) 5-325 MG tablet  Every 4 hours PRN      12/02/17 1532       Azalia Bilisampos, Rosellen Lichtenberger, MD 12/05/17 2339

## 2017-12-16 ENCOUNTER — Ambulatory Visit: Payer: TRICARE For Life (TFL) | Admitting: Physician Assistant

## 2017-12-17 ENCOUNTER — Ambulatory Visit (INDEPENDENT_AMBULATORY_CARE_PROVIDER_SITE_OTHER): Payer: TRICARE For Life (TFL) | Admitting: Family Medicine

## 2017-12-17 ENCOUNTER — Other Ambulatory Visit: Payer: Self-pay

## 2017-12-17 ENCOUNTER — Encounter: Payer: Self-pay | Admitting: Family Medicine

## 2017-12-17 VITALS — BP 113/76 | HR 117 | Temp 98.2°F | Wt 136.0 lb

## 2017-12-17 DIAGNOSIS — R300 Dysuria: Secondary | ICD-10-CM

## 2017-12-17 DIAGNOSIS — N3000 Acute cystitis without hematuria: Secondary | ICD-10-CM | POA: Diagnosis not present

## 2017-12-17 LAB — POCT URINALYSIS DIP (MANUAL ENTRY)
Bilirubin, UA: NEGATIVE
Glucose, UA: NEGATIVE mg/dL
Ketones, POC UA: NEGATIVE mg/dL
Nitrite, UA: NEGATIVE
Spec Grav, UA: 1.015 (ref 1.010–1.025)
Urobilinogen, UA: 0.2 E.U./dL
pH, UA: 6 (ref 5.0–8.0)

## 2017-12-17 MED ORDER — NITROFURANTOIN MONOHYD MACRO 100 MG PO CAPS: 100.0000 mg | ORAL_CAPSULE | Freq: Two times a day (BID) | ORAL | 0 refills | Status: DC

## 2017-12-17 NOTE — Progress Notes (Signed)
12/20/20186:07 PM  Joy HedgerBrandi White 08/14/1996, 21 y.o. female [id number]  Chief Complaint  Patient presents with  . Abdominal Pain    ON THE LEFT SIDE. BELIEVES SHE HAS UTI    HPI:   Patient is a 21 y.o. female with past medical history significant for Right side pyelonephritis 12/02/17 who presents today for dysuria, urinary frequency for past several days with mild LLQ abd pain and suprapubic discomfort, she denies any hematuria, nausea, vomiting, constipation, pelvic pain, vaginal discharge, flank pain, fever or chills. She complete abx course for pyelo about 5 days ago.   Right pyelo 12/02/17, e coli pan sensitive, treated with keflex.  Patient reports h/o recurrent UTIs in childhood but none recently, has never seen urology.   Depression screen PHQ 2/9 12/02/2017  Decreased Interest 0  Down, Depressed, Hopeless 0  PHQ - 2 Score 0    No Known Allergies  Prior to Admission medications   Medication Sig Start Date End Date Taking? Authorizing Provider  nitrofurantoin, macrocrystal-monohydrate, (MACROBID) 100 MG capsule Take 1 capsule (100 mg total) by mouth 2 (two) times daily. 12/17/17   Myles LippsSantiago, Tomoki Lucken M, MD    History reviewed. No pertinent past medical history.  History reviewed. No pertinent surgical history.  Social History   Tobacco Use  . Smoking status: Never Smoker  . Smokeless tobacco: Never Used  Substance Use Topics  . Alcohol use: No    History reviewed. No pertinent family history.  ROS Per hpi  OBJECTIVE:  Blood pressure 113/76, pulse (!) 117, temperature 98.2 F (36.8 C), temperature source Oral, weight 136 lb (61.7 kg), last menstrual period 12/05/2017, SpO2 96 %.  Physical Exam  Constitutional: She is oriented to person, place, and time and well-developed, well-nourished, and in no distress.  HENT:  Head: Normocephalic and atraumatic.  Mouth/Throat: Oropharynx is clear and moist. No oropharyngeal exudate.  Eyes: EOM are normal. Pupils  are equal, round, and reactive to light. No scleral icterus.  Neck: Neck supple.  Cardiovascular: Normal rate, regular rhythm and normal heart sounds. Exam reveals no gallop and no friction rub.  No murmur heard. Pulmonary/Chest: Effort normal and breath sounds normal. She has no wheezes. She has no rales.  Abdominal: Soft. Bowel sounds are normal. She exhibits no distension. There is no tenderness. There is no rebound, no guarding and no CVA tenderness.  Musculoskeletal: She exhibits no edema.  Neurological: She is alert and oriented to person, place, and time. Gait normal.  Skin: Skin is warm and dry.    Results for orders placed or performed in visit on 12/17/17 (from the past 24 hour(s))  POCT urinalysis dipstick     Status: Abnormal   Collection Time: 12/17/17  5:19 PM  Result Value Ref Range   Color, UA yellow yellow   Clarity, UA clear clear   Glucose, UA negative negative mg/dL   Bilirubin, UA negative negative   Ketones, POC UA negative negative mg/dL   Spec Grav, UA 4.0981.015 1.1911.010 - 1.025   Blood, UA trace-intact (A) negative   pH, UA 6.0 5.0 - 8.0   Protein Ur, POC trace (A) negative mg/dL   Urobilinogen, UA 0.2 0.2 or 1.0 E.U./dL   Nitrite, UA Negative Negative   Leukocytes, UA Large (3+) (A) Negative      ASSESSMENT and PLAN 1. Dysuria Discussed supportive measures, new meds r/se/b and RTC precautions. Patient educational handout given. Consider referral to urology if continues to have UTIs. - POCT urinalysis dipstick - Urine Culture  Other orders - nitrofurantoin, macrocrystal-monohydrate, (MACROBID) 100 MG capsule; Take 1 capsule (100 mg total) by mouth 2 (two) times daily.  Return if symptoms worsen or fail to improve.    Myles LippsIrma M Santiago, MD [facility] at [facility] [address] [place], [place] [address] Ph.  [phone] Fax [phone]

## 2017-12-17 NOTE — Patient Instructions (Signed)

## 2017-12-21 LAB — URINE CULTURE

## 2017-12-23 NOTE — Progress Notes (Signed)
On appropriate abx, no further action needed

## 2019-01-10 ENCOUNTER — Inpatient Hospital Stay (HOSPITAL_COMMUNITY)
Admission: AD | Admit: 2019-01-10 | Discharge: 2019-01-10 | Disposition: A | Discharge status: Home / Self Care | Payer: Self-pay | Attending: Obstetrics and Gynecology | Admitting: Obstetrics and Gynecology

## 2019-01-10 ENCOUNTER — Encounter (HOSPITAL_COMMUNITY): Payer: Self-pay

## 2019-01-10 ENCOUNTER — Inpatient Hospital Stay (HOSPITAL_COMMUNITY): Payer: Self-pay

## 2019-01-10 DIAGNOSIS — O4690 Antepartum hemorrhage, unspecified, unspecified trimester: Secondary | ICD-10-CM

## 2019-01-10 DIAGNOSIS — O209 Hemorrhage in early pregnancy, unspecified: Secondary | ICD-10-CM | POA: Insufficient documentation

## 2019-01-10 DIAGNOSIS — N390 Urinary tract infection, site not specified: Secondary | ICD-10-CM

## 2019-01-10 DIAGNOSIS — Z3A09 9 weeks gestation of pregnancy: Secondary | ICD-10-CM | POA: Insufficient documentation

## 2019-01-10 HISTORY — DX: Anemia, unspecified: D64.9

## 2019-01-10 LAB — CBC
HCT: 39.2 % (ref 36.0–46.0)
Hemoglobin: 13.3 g/dL (ref 12.0–15.0)
MCH: 29.1 pg (ref 26.0–34.0)
MCHC: 33.9 g/dL (ref 30.0–36.0)
MCV: 85.8 fL (ref 80.0–100.0)
NRBC: 0 % (ref 0.0–0.2)
Platelets: 266 10*3/uL (ref 150–400)
RBC: 4.57 MIL/uL (ref 3.87–5.11)
RDW: 16.1 % — AB (ref 11.5–15.5)
WBC: 9.4 10*3/uL (ref 4.0–10.5)

## 2019-01-10 LAB — URINALYSIS, ROUTINE W REFLEX MICROSCOPIC
Bilirubin Urine: NEGATIVE
Glucose, UA: NEGATIVE mg/dL
Ketones, ur: 80 mg/dL — AB
Leukocytes, UA: NEGATIVE
Nitrite: POSITIVE — AB
PROTEIN: NEGATIVE mg/dL
Specific Gravity, Urine: 1.018 (ref 1.005–1.030)
pH: 6 (ref 5.0–8.0)

## 2019-01-10 LAB — WET PREP, GENITAL
Clue Cells Wet Prep HPF POC: NONE SEEN
Sperm: NONE SEEN
Trich, Wet Prep: NONE SEEN
WBC, Wet Prep HPF POC: NONE SEEN
Yeast Wet Prep HPF POC: NONE SEEN

## 2019-01-10 LAB — TYPE AND SCREEN
ABO/RH(D): B POS
Antibody Screen: NEGATIVE

## 2019-01-10 LAB — ABO/RH: ABO/RH(D): B POS

## 2019-01-10 LAB — HCG, QUANTITATIVE, PREGNANCY: hCG, Beta Chain, Quant, S: 30860 m[IU]/mL — ABNORMAL HIGH (ref <5)

## 2019-01-10 MED ORDER — NITROFURANTOIN MONOHYD MACRO 100 MG PO CAPS: 100.0000 mg | ORAL_CAPSULE | Freq: Two times a day (BID) | ORAL | 0 refills | Status: AC

## 2019-01-10 NOTE — MAU Note (Signed)
Pt having intermittent abdominal cramping for about a week. Confirmed pregnancy at the health department. Has had some spotting which has gotten darker the last day or two. Health department sent her here for evaluation.

## 2019-01-10 NOTE — MAU Provider Note (Signed)
History    CSN: [id number] Arrival date and time: 01/10/19 1523 First Provider Initiated Contact with Patient 01/10/19 1610     Chief Complaint  Patient presents with  . Abdominal Pain  . Vaginal Bleeding   HPI 22yo [id number] at [redacted]w[redacted]d by LMP who presents with abdominal cramping and vaginal bleeding for last 4 days. States initially had light pink discharge, then some slight bleeding with wiping, now red blood occasionally throughout the day. Has not had to change a panty liner because of excessive bleeding. Denies clots. Cramping comes/goes. Was seen at HD today and told to come to MAU for evaluation. History of two prior therapeutic abortions.   OB History    Gravida  3   Para      Term      Preterm      AB  2   Living        SAB      TAB      Ectopic      Multiple      Live Births              Past Medical History:  Diagnosis Date  . Anemia     Past Surgical History:  Procedure Laterality Date  . DILATION AND CURETTAGE OF UTERUS      History reviewed. No pertinent family history.  Social History   Tobacco Use  . Smoking status: Never Smoker  . Smokeless tobacco: Never Used  Substance Use Topics  . Alcohol use: Not Currently    Comment: socially  . Drug use: Yes    Types: Marijuana    Comment: last used 12-29-18    Allergies: No Known Allergies  Medications Prior to Admission  Medication Sig Dispense Refill Last Dose  . nitrofurantoin, macrocrystal-monohydrate, (MACROBID) 100 MG capsule Take 1 capsule (100 mg total) by mouth 2 (two) times daily. 14 capsule 0     Review of Systems  Constitutional: Negative for activity change and fever.  Gastrointestinal: Negative for abdominal pain.  Genitourinary: Positive for pelvic pain and vaginal bleeding. Negative for dysuria, flank pain, genital sores, vaginal discharge and vaginal pain.  Musculoskeletal: Negative for back pain.  Neurological: Negative for dizziness and headaches.   Psychiatric/Behavioral: Negative for sleep disturbance. The patient is not nervous/anxious.    Physical Exam   Blood pressure 134/70, pulse 88, temperature 98.8 F (37.1 C), temperature source Oral, resp. rate 16, height 5\' 5"  (1.651 m), weight 73.5 kg, last menstrual period 11/08/2018, SpO2 100 %.  Physical Exam  Nursing note and vitals reviewed. Constitutional: She is oriented to person, place, and time. She appears well-developed and well-nourished. No distress.  Playing on phone  HENT:  Head: Normocephalic and atraumatic.  Eyes: Conjunctivae and EOM are normal.  Cardiovascular: Normal rate.  Respiratory: Effort normal. No respiratory distress.  GI: Soft. There is no abdominal tenderness. There is no guarding.  Genitourinary:    Genitourinary Comments: Normal appearing labia, normal vaginal mucosa with red blood in vault, normal appearing/closed cervix with blood coming from os, no lesions noted  no CMT    Musculoskeletal:        General: No edema.  Neurological: She is alert and oriented to person, place, and time.  Skin: Skin is warm and dry. No rash noted.  Psychiatric: She has a normal mood and affect. Her behavior is normal.    MAU Course  Procedures  MDM -- positive pregnancy test from HD (to be scanned into media) -- unclear  pregnancy location with bleeding - need to rule out ectopic - U/S, T&S, CBC, and quant bhCG pending -- wet prep and G/C sent, U/A in process  -- U/A suspicious for bacterial infection - will send ob culture and start treatment with macrobid -- U/S concerning for failed pregnancy -- blood type B+ - no indication for Rhogam   US OB LESS THAN 14 WEEKS WITH OB TRANSVAGINAL CLINICAL DATA:  Vaginal bleeding during pregnancy.  EXAM: OBSTETRIC <14 WK [place] AND TRANSVAGINAL OB US  TECHNIQUE: Both transabdominal and transvaginal ultrasound examinations were performed for complete evaluation of the gestation as well as the maternal uterus, adnexal  regions, and pelvic cul-de-sac. Transvaginal technique was performed to assess early pregnancy.  COMPARISON:  None.  FINDINGS: Intrauterine gestational sac: Probable intrauterine gestational sac.  Yolk sac:  No  Embryo:  No  MSD: 16.5 mm   6 w   3 d  CRL:    mm    w    d                  [place] EDC:  Subchorionic hemorrhage:  Suggested small subchorionic hemorrhage  Maternal uterus/adnexae: Normal.  IMPRESSION: 1. There is an oval collection of fluid in the endometrium. There are some internal echoes and possibly a septation. No yolk sac or fetal pole identified. I suspect this finding represents a gestational sac/intrauterine pregnancy. However, given the lack of an embryo and a mean sac diameter of greater than 16 mm, the findings are suspicious for a nonviable pregnancy. A pseudo gestational sac is considered less likely. Recommend clinical correlation and close follow-up labs and imaging.  Findings are suspicious but not yet definitive for failed pregnancy. Recommend follow-up US in 10-14 days for definitive diagnosis. This recommendation follows SRU consensus guidelines: Diagnostic Criteria for Nonviable Pregnancy Early in the First Trimester. Malva Limes Med 2013; 409:8119-14.  Electronically Signed   By: Gerome Sam III M.D   On: 01/10/2019 17:47  Assessment and Plan  22yo [id number] at [redacted]w[redacted]d by LMP who presents with vaginal bleeding and cramping. U/S concerning for failed pregnancy, gestational sac measuring closer to 6 weeks. Discussed results with patient who was appropriately tearful. Discussed recommendation for repeat quant in 48 hours. Appointment made in [facility] 01/12/18. Also will treat presumed UTI with macrobid and f/u urine culture. All questions answered, return precautions reviewed, patient in agreement with plan.   Tamera Stands, DO  01/10/2019, 4:41 PM

## 2019-01-10 NOTE — Discharge Instructions (Signed)
·   You were seen in the MAU for vaginal bleeding in early pregnancy. We did blood tests and an ultrasound to evaluate the source of the bleeding. The ultrasound is concerning for a possible miscarriage or non-viable pregnancy.   You should return on Wednesday to have a repeat hormone level drawn. The nurse will review the results with you and will likely recommend you have a follow-up ultrasound in about 2 weeks.   Return to MAU if you have worsening pain or bleeding with large clots, nausea and vomiting that doesn't stop, or dizziness or lightheadedness.

## 2019-01-11 LAB — CULTURE, OB URINE

## 2019-01-11 LAB — GC/CHLAMYDIA PROBE AMP (~~LOC~~) NOT AT ARMC
Chlamydia: NEGATIVE
Neisseria Gonorrhea: NEGATIVE

## 2019-01-12 ENCOUNTER — Inpatient Hospital Stay (HOSPITAL_COMMUNITY)
Admission: AD | Admit: 2019-01-12 | Discharge: 2019-01-12 | Discharge status: Absent without leave | Payer: Self-pay | Attending: Obstetrics & Gynecology | Admitting: Obstetrics & Gynecology

## 2019-01-12 ENCOUNTER — Ambulatory Visit (INDEPENDENT_AMBULATORY_CARE_PROVIDER_SITE_OTHER): Payer: Self-pay

## 2019-01-12 ENCOUNTER — Encounter: Payer: Self-pay | Admitting: Family Medicine

## 2019-01-12 DIAGNOSIS — O3680X0 Pregnancy with inconclusive fetal viability, not applicable or unspecified: Secondary | ICD-10-CM

## 2019-01-12 LAB — HCG, QUANTITATIVE, PREGNANCY: hCG, Beta Chain, Quant, S: 24983 m[IU]/mL — ABNORMAL HIGH (ref <5)

## 2019-01-12 NOTE — Progress Notes (Signed)
Pt here today for STAT beta Lab.  Pt denies any pain and some bloody show when wiping.  Pt advised to wait in waiting room for approximately two hours for results and f/u.  Pt agreed.    Notified Dr. Shawnie Pons pt's results and sx.  Per provider recommendation pt seems to be having a miscarriage, pt needs to have another STAT beta in 48 hrs. Notified pt of provider's recommendation.  Pt agreed.

## 2019-01-13 ENCOUNTER — Telehealth: Payer: Self-pay | Admitting: *Deleted

## 2019-01-13 ENCOUNTER — Telehealth: Payer: Self-pay | Admitting: Obstetrics & Gynecology

## 2019-01-13 ENCOUNTER — Ambulatory Visit: Payer: Self-pay | Admitting: Obstetrics & Gynecology

## 2019-01-13 NOTE — Telephone Encounter (Signed)
Per Dr. Marice Potter, pt does not need to be seen by a provider today but needs to follow up with lab in 1 week.  Pt did not pick up.  Left message informing pt that she did not need to keep her scheduled appointment today but instead to call the office to discuss future appointments.

## 2019-01-13 NOTE — Telephone Encounter (Signed)
Called pt to get her scheduled today per dove to see MD. No answer left VM with office number to give [place] a call back today.

## 2019-01-13 NOTE — Telephone Encounter (Signed)
Pt reports increase in bleeding from spotting to needing to change a pad every 2 hours.  Pt denies clots.  Pt reports some cramps that are intermittent and like period cramps. Discussed with Dr. Alysia Penna.  Pt informed this was part of the normal process for her suspected miscarriage.  Pt instructed to come to MAU if she is having severe cramps that don't improve with medication or heat and she feels that she needs to be seen, or if she is saturating a pad with blood in clots every hour.  Pt verbalized understanding.

## 2019-01-14 ENCOUNTER — Ambulatory Visit: Payer: Self-pay

## 2019-01-15 ENCOUNTER — Inpatient Hospital Stay (HOSPITAL_COMMUNITY): Payer: Self-pay

## 2019-01-15 ENCOUNTER — Inpatient Hospital Stay (HOSPITAL_COMMUNITY)
Admission: AD | Admit: 2019-01-15 | Discharge: 2019-01-15 | Disposition: A | Discharge status: Home / Self Care | Payer: Self-pay | Source: Ambulatory Visit | Attending: Obstetrics and Gynecology | Admitting: Obstetrics and Gynecology

## 2019-01-15 ENCOUNTER — Encounter (HOSPITAL_COMMUNITY): Payer: Self-pay | Admitting: Student

## 2019-01-15 DIAGNOSIS — O039 Complete or unspecified spontaneous abortion without complication: Secondary | ICD-10-CM | POA: Insufficient documentation

## 2019-01-15 DIAGNOSIS — Z3A09 9 weeks gestation of pregnancy: Secondary | ICD-10-CM | POA: Insufficient documentation

## 2019-01-15 DIAGNOSIS — O209 Hemorrhage in early pregnancy, unspecified: Secondary | ICD-10-CM

## 2019-01-15 LAB — URINALYSIS, ROUTINE W REFLEX MICROSCOPIC
Bacteria, UA: NONE SEEN
Bilirubin Urine: NEGATIVE
Glucose, UA: NEGATIVE mg/dL
KETONES UR: 80 mg/dL — AB
Nitrite: NEGATIVE
Protein, ur: NEGATIVE mg/dL
Specific Gravity, Urine: 1.03 (ref 1.005–1.030)
pH: 5 (ref 5.0–8.0)

## 2019-01-15 LAB — CBC
HCT: 39 % (ref 36.0–46.0)
Hemoglobin: 12.9 g/dL (ref 12.0–15.0)
MCH: 28.8 pg (ref 26.0–34.0)
MCHC: 33.1 g/dL (ref 30.0–36.0)
MCV: 87.1 fL (ref 80.0–100.0)
Platelets: 286 10*3/uL (ref 150–400)
RBC: 4.48 MIL/uL (ref 3.87–5.11)
RDW: 16.2 % — ABNORMAL HIGH (ref 11.5–15.5)
WBC: 9.1 10*3/uL (ref 4.0–10.5)
nRBC: 0 % (ref 0.0–0.2)

## 2019-01-15 LAB — HCG, QUANTITATIVE, PREGNANCY: hCG, Beta Chain, Quant, S: 22965 m[IU]/mL — ABNORMAL HIGH (ref <5)

## 2019-01-15 MED ORDER — HYDROMORPHONE HCL 1 MG/ML IJ SOLN
1.0000 mg | Freq: Once | INTRAMUSCULAR | Status: AC
  Administered 2019-01-15: 1 mg via INTRAVENOUS
  Filled 2019-01-15: qty 1

## 2019-01-15 MED ORDER — DEXTROSE 5 % IN LACTATED RINGERS IV BOLUS
1000.0000 mL | Freq: Once | INTRAVENOUS | Status: AC
  Administered 2019-01-15: 1000 mL via INTRAVENOUS

## 2019-01-15 MED ORDER — PROMETHAZINE HCL 25 MG PO TABS: 25.0000 mg | ORAL_TABLET | Freq: Four times a day (QID) | ORAL | 0 refills | Status: DC | PRN

## 2019-01-15 MED ORDER — TRAMADOL HCL 50 MG PO TABS: 50.0000 mg | ORAL_TABLET | Freq: Four times a day (QID) | ORAL | 0 refills | Status: DC | PRN

## 2019-01-15 MED ORDER — MISOPROSTOL 200 MCG PO TABS: 600.0000 ug | ORAL_TABLET | Freq: Once | ORAL | 1 refills | Status: DC

## 2019-01-15 MED ORDER — METOCLOPRAMIDE HCL 5 MG/ML IJ SOLN
10.0000 mg | Freq: Once | INTRAMUSCULAR | Status: AC
  Administered 2019-01-15: 10 mg via INTRAVENOUS
  Filled 2019-01-15: qty 2

## 2019-01-15 NOTE — Discharge Instructions (Signed)

## 2019-01-15 NOTE — MAU Note (Signed)
I was here earlier this wk and unsure of pregnancy location. Having cramping for several days but nonstop today. Have been bleeding for a wk but not heavy.

## 2019-01-15 NOTE — MAU Provider Note (Signed)
Chief Complaint: Abdominal Pain   First Provider Initiated Contact with Patient 01/15/19 2057     SUBJECTIVE HPI: Joy White is a 23 y.o. G3P0020 at 724w5d who presents to [facility] reporting abdominal pain, vaginal bleeding, and nausea. Had ultrasound last week & diagnosed with threatened miscarriage.  Vaginal bleeding has continued. Not saturating pads or passing clots. Lower abdominal cramping feels like contractions. Has been taking tylenol & ibuprofen with some relief. Also has been nauseated. Vomited once today after taking medication. Hasn't had much to eat or drink today d/t the nausea.   Location: abdomen Quality: cramping Severity: 7/10 on pain scale Duration: 3 days Timing: intermittent Modifying factors: none Associated signs and symptoms: vaginal bleeding  Past Medical History:  Diagnosis Date  . Anemia    OB History  Gravida Para Term Preterm AB Living  3       2    SAB TAB Ectopic Multiple Live Births    2          # Outcome Date GA Lbr Len/2nd Weight Sex Delivery Anes PTL Lv  3 Current           2 TAB           1 TAB            Past Surgical History:  Procedure Laterality Date  . DILATION AND CURETTAGE OF UTERUS     Social History   Socioeconomic History  . Marital status: Single    Spouse name: Not on file  . Number of children: Not on file  . Years of education: Not on file  . Highest education level: Not on file  Occupational History  . Not on file  Social Needs  . Financial resource strain: Not on file  . Food insecurity:    Worry: Not on file    Inability: Not on file  . Transportation needs:    Medical: Not on file    Non-medical: Not on file  Tobacco Use  . Smoking status: Never Smoker  . Smokeless tobacco: Never Used  Substance and Sexual Activity  . Alcohol use: Not Currently    Comment: socially  . Drug use: Yes    Types: Marijuana    Comment: last used 12-29-18  . Sexual activity: Yes  Lifestyle  . Physical  activity:    Days per week: Not on file    Minutes per session: Not on file  . Stress: Not on file  Relationships  . Social connections:    Talks on phone: Not on file    Gets together: Not on file    Attends religious service: Not on file    Active member of club or organization: Not on file    Attends meetings of clubs or organizations: Not on file    Relationship status: Not on file  . Intimate partner violence:    Fear of current or ex partner: Not on file    Emotionally abused: Not on file    Physically abused: Not on file    Forced sexual activity: Not on file  Other Topics Concern  . Not on file  Social History Narrative  . Not on file   History reviewed. No pertinent family history. No current facility-administered medications on file prior to encounter.    No current outpatient medications on file prior to encounter.   No Known Allergies  I have reviewed patient's Past Medical Hx, Surgical Hx, Family Hx, Social Hx, medications and allergies.  Review of Systems  Constitutional: Negative.   Gastrointestinal: Positive for abdominal pain, nausea and vomiting. Negative for constipation and diarrhea.  Genitourinary: Positive for vaginal bleeding. Negative for dysuria and vaginal discharge.    OBJECTIVE Patient Vitals for the past 24 hrs:  BP Temp Pulse Resp Height Weight  01/15/19 2320 (!) 112/54 - 74 18 - -  01/15/19 2021 129/67 98.5 F (36.9 C) 73 16 5\' 5"  (1.651 m) 72.6 kg   Constitutional: Well-developed, well-nourished female in no acute distress.  Cardiovascular: normal rate & rhythm, no murmur Respiratory: normal rate and effort. Lung sounds clear throughout GI: Abd soft, non-tender, Pos BS x 4. No guarding or rebound tenderness MS: Extremities nontender, no edema, normal ROM Neurologic: Alert and oriented x 4.  GU:     SPECULUM EXAM: NEFG, small amount of dark red blood on exam.   BIMANUAL: No CMT. cervix closed; uterus normal size, no adnexal tenderness  or masses.    LAB RESULTS Results for orders placed or performed during the hospital encounter of 01/15/19 (from the past 24 hour(s))  Urinalysis, Routine w reflex microscopic     Status: Abnormal   Collection Time: 01/15/19  8:27 PM  Result Value Ref Range   Color, Urine AMBER (A) YELLOW   APPearance CLEAR CLEAR   Specific Gravity, Urine 1.030 1.005 - 1.030   pH 5.0 5.0 - 8.0   Glucose, UA NEGATIVE NEGATIVE mg/dL   Hgb urine dipstick MODERATE (A) NEGATIVE   Bilirubin Urine NEGATIVE NEGATIVE   Ketones, ur 80 (A) NEGATIVE mg/dL   Protein, ur NEGATIVE NEGATIVE mg/dL   Nitrite NEGATIVE NEGATIVE   Leukocytes, UA TRACE (A) NEGATIVE   RBC / HPF 0-5 0 - 5 RBC/hpf   WBC, UA 0-5 0 - 5 WBC/hpf   Bacteria, UA NONE SEEN NONE SEEN   Squamous Epithelial / LPF 0-5 0 - 5   Mucus PRESENT   CBC     Status: Abnormal   Collection Time: 01/15/19  9:20 PM  Result Value Ref Range   WBC 9.1 4.0 - 10.5 K/uL   RBC 4.48 3.87 - 5.11 MIL/uL   Hemoglobin 12.9 12.0 - 15.0 g/dL   HCT 40.939.0 81.136.0 - 91.446.0 %   MCV 87.1 80.0 - 100.0 fL   MCH 28.8 26.0 - 34.0 pg   MCHC 33.1 30.0 - 36.0 g/dL   RDW 78.216.2 (H) 95.611.5 - 21.315.5 %   Platelets 286 150 - 400 K/uL   nRBC 0.0 0.0 - 0.2 %  hCG, quantitative, pregnancy     Status: Abnormal   Collection Time: 01/15/19  9:20 PM  Result Value Ref Range   hCG, Beta Chain, Quant, S 22,965 (H) <5 mIU/mL    IMAGING [place] Ob Transvaginal  Result Date: 01/15/2019 CLINICAL DATA:  23 year old female with positive but decreasing HCG levels presenting with vaginal bleeding and cramping. LMP: 11/08/2018 corresponding to an estimated gestational age of [redacted] weeks, 5 days. EXAM: TRANSVAGINAL OB ULTRASOUND TECHNIQUE: Transvaginal ultrasound was performed for complete evaluation of the gestation as well as the maternal uterus, adnexal regions, and pelvic cul-de-sac. COMPARISON:  Ultrasound dated 01/10/2019 FINDINGS: There is a slightly irregular sac-like structure within the endometrium with internal  septations similar to the prior ultrasound. No yolk sac or fetal pole identified. This cystic structure may represent a gestational sac, a blighted ovum, or a pseudo gestational of an ectopic pregnancy. If this is a gestational sac the estimated gestational age based on mean sac diameter of 16 mm  is 6 weeks, 3 days. However, given the mean sac diameter of 16 mm and absence of embryo findings are suspicious but not diagnostic for failed pregnancy. The maternal ovaries are unremarkable. No free fluid within the pelvis. IMPRESSION: Irregular sac-like structure as described with no significant interval growth since the prior ultrasound. No fetal pole or yolk sac. Findings are suspicious but not diagnostic for a failed pregnancy at this time. Clinical correlation and follow-up with ultrasound in 10-14 days, or earlier if clinically indicated, recommended. Electronically Signed   By: Elgie Collard M.D.   On: 01/15/2019 22:46    MAU COURSE Orders Placed This Encounter  Procedures  . US OB Transvaginal  . Urinalysis, Routine w reflex microscopic  . CBC  . hCG, quantitative, pregnancy  . Discharge patient   Meds ordered this encounter  Medications  . dextrose 5% lactated ringers bolus 1,000 mL  . metoCLOPramide (REGLAN) injection 10 mg  . HYDROmorphone (DILAUDID) injection 1 mg  . traMADol (ULTRAM) 50 MG tablet    Sig: Take 1 tablet (50 mg total) by mouth every 6 (six) hours as needed for severe pain.    Dispense:  60 tablet    Refill:  0    Order Specific Question:   Supervising Provider    Answer:   CONSTANT, PEGGY [[id number]]  . promethazine (PHENERGAN) 25 MG tablet    Sig: Take 1 tablet (25 mg total) by mouth every 6 (six) hours as needed for nausea or vomiting.    Dispense:  30 tablet    Refill:  0    Order Specific Question:   Supervising Provider    Answer:   CONSTANT, PEGGY [[id number]]  . misoprostol (CYTOTEC) 200 MCG tablet    Sig: Take 3 tablets (600 mcg total) by mouth once for 1 dose.     Dispense:  3 tablet    Refill:  1    Order Specific Question:   Supervising Provider    Answer:   CONSTANT, PEGGY [[id number]]    MDM Rh positive HCG is consistently dropping & ultrasound today is same as 1/13. Empty IUGS measuring 16.4 mm.  Discussed results with Dr. Jolayne Panther. Will offer patient cytotec for management of miscarriage.   Discussed options for management of incomplete AB including expectant management, Cytotec or D&C. Prefers cytotec at this time. Verbalizes understanding that intervention may become necessary if SAB in not completed spontaneously or if heavy bleeding or infection occur.      Early Intrauterine Pregnancy Failure Protocol X  Documented intrauterine pregnancy failure less than or equal to [redacted] weeks   gestation  X  No serious current illness  X  Baseline Hgb greater than or equal to 10g/dl  X  Patient has easily accessible transportation to the hospital  X  Clear preference  X  Practitioner/physician deems patient reliable  X  Counseling by practitioner or physician  X  Patient education by RN  X  Consent form signed   n/a   Rho-Gam given by RN if indicated  X  Cytotec 600 mcg buccally by patient at home       Intravaginally by NP in MAU       Rectally by patient at home       Rectally by RN in MAU  X  Ultram 50 mg Q6 hrs prn pain prescribed _   Tylenol #3 mg by mouth every 4 to 6 hours as needed - prescribed  X   Phenergan 12.5 mg by mouth  every 4 hours as needed for nausea - prescribed  Reviewed with pt cytotec procedure.  Pt verbalizes that she lives close to the hospital and has transportation readily available.  Pt appears reliable and verbalizes understanding and agrees with plan of care   ASSESSMENT 1. Miscarriage   2. Vaginal bleeding in pregnancy, first trimester     PLAN Discharge home in stable condition. Discussed reasons to return to MAU Msg sent to clinic to schedule f/u  Follow-up Information    [facility]  Follow up.   Specialty:  Obstetrics and Gynecology Why:  Office will call you to schedule follow up appointments Contact information: [address] [place] [address] [phone]         Allergies as of 01/15/2019   No Known Allergies     Medication List    STOP taking these medications   metroNIDAZOLE 250 MG tablet Commonly known as:  FLAGYL     TAKE these medications   misoprostol 200 MCG tablet Commonly known as:  CYTOTEC Take 3 tablets (600 mcg total) by mouth once for 1 dose.   promethazine 25 MG tablet Commonly known as:  PHENERGAN Take 1 tablet (25 mg total) by mouth every 6 (six) hours as needed for nausea or vomiting.   traMADol 50 MG tablet Commonly known as:  ULTRAM Take 1 tablet (50 mg total) by mouth every 6 (six) hours as needed for severe pain.     ASK your doctor about these medications   nitrofurantoin (macrocrystal-monohydrate) 100 MG capsule Commonly known as:  MACROBID Take 1 capsule (100 mg total) by mouth 2 (two) times daily for 5 days. Ask about: Should I take this medication?        Judeth Horn, NP 01/16/2019  4:28 AM

## 2019-01-16 ENCOUNTER — Encounter (HOSPITAL_COMMUNITY): Payer: Self-pay

## 2019-01-16 ENCOUNTER — Inpatient Hospital Stay (HOSPITAL_COMMUNITY): Payer: Self-pay

## 2019-01-16 ENCOUNTER — Inpatient Hospital Stay (HOSPITAL_COMMUNITY)
Admission: AD | Admit: 2019-01-16 | Discharge: 2019-01-17 | Disposition: A | Discharge status: Home / Self Care | Payer: Self-pay | Source: Ambulatory Visit | Attending: Obstetrics & Gynecology | Admitting: Obstetrics & Gynecology

## 2019-01-16 DIAGNOSIS — O021 Missed abortion: Secondary | ICD-10-CM | POA: Insufficient documentation

## 2019-01-16 DIAGNOSIS — N939 Abnormal uterine and vaginal bleeding, unspecified: Secondary | ICD-10-CM

## 2019-01-16 LAB — CBC
HCT: 34.4 % — ABNORMAL LOW (ref 36.0–46.0)
Hemoglobin: 11.4 g/dL — ABNORMAL LOW (ref 12.0–15.0)
MCH: 28.9 pg (ref 26.0–34.0)
MCHC: 33.1 g/dL (ref 30.0–36.0)
MCV: 87.1 fL (ref 80.0–100.0)
Platelets: 282 10*3/uL (ref 150–400)
RBC: 3.95 MIL/uL (ref 3.87–5.11)
RDW: 16.3 % — ABNORMAL HIGH (ref 11.5–15.5)
WBC: 12.5 10*3/uL — ABNORMAL HIGH (ref 4.0–10.5)
nRBC: 0 % (ref 0.0–0.2)

## 2019-01-16 MED ORDER — HYDROMORPHONE HCL 1 MG/ML IJ SOLN: 1.0000 mg | Freq: Once | INTRAMUSCULAR | Status: DC

## 2019-01-16 MED ORDER — LACTATED RINGERS IV BOLUS
1000.0000 mL | Freq: Once | INTRAVENOUS | Status: AC
  Administered 2019-01-16: 1000 mL via INTRAVENOUS

## 2019-01-16 MED ORDER — HYDROMORPHONE HCL 1 MG/ML IJ SOLN
1.0000 mg | Freq: Once | INTRAMUSCULAR | Status: AC
  Administered 2019-01-16: 1 mg via INTRAVENOUS
  Filled 2019-01-16: qty 1

## 2019-01-16 MED ORDER — OXYCODONE-ACETAMINOPHEN 5-325 MG PO TABS
2.0000 | ORAL_TABLET | Freq: Once | ORAL | Status: AC
  Administered 2019-01-17: 2 via ORAL
  Filled 2019-01-16: qty 2

## 2019-01-16 NOTE — MAU Provider Note (Signed)
History     CSN: [id number]  Arrival date and time: 01/16/19 2025   First Provider Initiated Contact with Patient 01/16/19 2151      Chief Complaint  Patient presents with  . Abdominal Pain   HPI Joy White is a 23 y.o. G3P0020 who presents with pain and bleeding after cytotec for a missed AB. She states she took the medicine around 1400 and immediately started cramping. She was saturating 1 pad an hour and the pain became too much. She took ultram at 1400 but none since then.   OB History    Gravida  3   Para      Term      Preterm      AB  2   Living        SAB  0   TAB  2   Ectopic      Multiple      Live Births              Past Medical History:  Diagnosis Date  . Anemia     Past Surgical History:  Procedure Laterality Date  . DILATION AND CURETTAGE OF UTERUS      No family history on file.  Social History   Tobacco Use  . Smoking status: Never Smoker  . Smokeless tobacco: Never Used  Substance Use Topics  . Alcohol use: Not Currently    Comment: socially  . Drug use: Yes    Types: Marijuana    Comment: last used 12-29-18    Allergies: No Known Allergies  Medications Prior to Admission  Medication Sig Dispense Refill Last Dose  . misoprostol (CYTOTEC) 200 MCG tablet Take 3 tablets (600 mcg total) by mouth once for 1 dose. 3 tablet 1   . promethazine (PHENERGAN) 25 MG tablet Take 1 tablet (25 mg total) by mouth every 6 (six) hours as needed for nausea or vomiting. 30 tablet 0   . traMADol (ULTRAM) 50 MG tablet Take 1 tablet (50 mg total) by mouth every 6 (six) hours as needed for severe pain. 60 tablet 0     Review of Systems  Constitutional: Negative.  Negative for fatigue and fever.  HENT: Negative.   Respiratory: Negative.  Negative for shortness of breath.   Cardiovascular: Negative.  Negative for chest pain.  Gastrointestinal: Positive for abdominal pain. Negative for constipation, diarrhea, nausea and vomiting.   Genitourinary: Positive for vaginal bleeding. Negative for dysuria.  Neurological: Negative.  Negative for dizziness and headaches.   Physical Exam   Blood pressure (!) 113/50, pulse 70, temperature 99.1 F (37.3 C), temperature source Oral, resp. rate 16, last menstrual period 11/08/2018, SpO2 100 %.  Physical Exam  Nursing note and vitals reviewed. Constitutional: She is oriented to person, place, and time. She appears well-developed and well-nourished. No distress.  HENT:  Head: Normocephalic.  Eyes: Pupils are equal, round, and reactive to light.  Cardiovascular: Normal rate, regular rhythm and normal heart sounds.  Respiratory: Effort normal and breath sounds normal. No respiratory distress.  GI: Soft. Bowel sounds are normal. She exhibits no distension. There is no abdominal tenderness.  Genitourinary:    Genitourinary Comments: Moderate amount of bright red bleeding in vault. Many small clots and small amount of possible POC removed    Neurological: She is alert and oriented to person, place, and time.  Skin: Skin is warm and dry.  Psychiatric: She has a normal mood and affect. Her behavior is normal. Judgment and thought  content normal.    MAU Course  Procedures Results for orders placed or performed during the hospital encounter of 01/16/19 (from the past 24 hour(s))  CBC     Status: Abnormal   Collection Time: 01/16/19  9:44 PM  Result Value Ref Range   WBC 12.5 (H) 4.0 - 10.5 K/uL   RBC 3.95 3.87 - 5.11 MIL/uL   Hemoglobin 11.4 (L) 12.0 - 15.0 g/dL   HCT 11.934.4 (L) 14.736.0 - 82.946.0 %   MCV 87.1 80.0 - 100.0 fL   MCH 28.9 26.0 - 34.0 pg   MCHC 33.1 30.0 - 36.0 g/dL   RDW 56.216.3 (H) 13.011.5 - 86.515.5 %   Platelets 282 150 - 400 K/uL   nRBC 0.0 0.0 - 0.2 %   [place] Pelvis Transvanginal Non-ob (tv Only)  Result Date: 01/17/2019 CLINICAL DATA:  23 year old female with spontaneous abortion in progress. EXAM: ULTRASOUND PELVIS TRANSVAGINAL TECHNIQUE: Transvaginal ultrasound examination  of the pelvis was performed including evaluation of the uterus, ovaries, adnexal regions, and pelvic cul-de-sac. COMPARISON:  Ultrasound dated 01/15/2019 FINDINGS: Uterus Measurements: 9.5 x 4.7 x 6.1 cm = volume: 272 mL. The uterus is anteverted. Endometrium Thickness: Thickened measuring approximately 31 mm. There is a 2.0 x 1.2 x 2.2 cm sac-like structure corresponding to the structure seen on the prior ultrasound. This is now located in the lower endometrium in the region of the cervix. There is complex tissue with vascular flow within the endometrium superior to the sac-like structure consistent with blood clot and products of conception. Right ovary Measurements: 3.0 x 2.5 x 2.7 cm = volume: 20 mL. Normal appearance/no adnexal mass. Left ovary Measurements: Not visualized. Other findings:  No abnormal free fluid IMPRESSION: Previously seen sac-like structure is located in the lower endometrium or endocervical region in keeping with miscarriage. Complex tissue superior to the gestational sac represent combination of blood clot and products of conception. Follow-up with HCG levels and ultrasound recommended. Electronically Signed   By: Elgie CollardArash  Radparvar M.D.   On: 01/17/2019 00:27   MDM CBC US Pelvic Comp  POC still seen in uterus. Discussed with patient options of repeating cytotec as prescribed yesterday or D&C scheduled. Patient wants to repeat cytotec at home.   Assessment and Plan   1. Missed abortion   2. Vaginal bleeding, abnormal    -Discharge home in stable condition -Rx for ibuprofen and cytotec given to patient -Vaginal bleeding and pain precautions discussed -[facility] message sent yesterday to schedule [facility] -Patient may return to MAU as needed or if her condition were to change or worsen  Rolm BookbinderCaroline M Neill CNM 01/16/2019, 9:51 PM

## 2019-01-16 NOTE — MAU Note (Signed)
Presents with lower, abd pain around 2p today after taking Cytotec po as directed. Pain 10/10. Pt vomited medication 40 min after taking it. Pt with nausea and chills. Dizziness started approx 20 min ago.   Adah Perl RN

## 2019-01-17 ENCOUNTER — Telehealth: Payer: Self-pay | Admitting: Student

## 2019-01-17 ENCOUNTER — Encounter (HOSPITAL_COMMUNITY): Payer: Self-pay

## 2019-01-17 DIAGNOSIS — O021 Missed abortion: Secondary | ICD-10-CM

## 2019-01-17 MED ORDER — IBUPROFEN 800 MG PO TABS: 800.0000 mg | ORAL_TABLET | Freq: Three times a day (TID) | ORAL | 0 refills | Status: DC

## 2019-01-17 MED ORDER — MISOPROSTOL 200 MCG PO TABS: 600.0000 ug | ORAL_TABLET | Freq: Once | ORAL | 0 refills | Status: DC

## 2019-01-17 NOTE — Discharge Instructions (Signed)
Coping with Pregnancy Loss  Pregnancy loss can happen any time during a pregnancy. Often the cause is not known. It is rarely because of anything you did. Pregnancy loss in early pregnancy (during the first trimester) is called a miscarriage. This type of pregnancy loss is the most common. Pregnancy loss that happens after 20 weeks of pregnancy is called fetal demise if the baby's heart stops beating before birth. Fetal demise is much less common. Some women experience spontaneous labor shortly after fetal demise resulting in a stillborn birth (stillbirth).  Any pregnancy loss can be devastating. You will need to recover both physically and emotionally. Most women are able to get pregnant again after a pregnancy loss and deliver a healthy baby.  How to manage emotional recovery    Pregnancy loss is very hard emotionally. You may feel many different emotions while you grieve. You may feel sad and angry. You may also feel guilty. It is normal to have periods of crying. Emotional recovery can take longer than physical recovery. It is different for everyone.  Taking these steps can help you cope:  · Remember that it is unlikely you did anything to cause the pregnancy loss.  · Share your thoughts and feelings with friends, family, and your partner. Remember that your partner is also recovering emotionally.  · Make sure you have a good support system, and do not spend too much time alone.  · Meet with a pregnancy loss counselor or join a pregnancy loss support group.  · Get enough sleep and eat a healthy diet. Return to regular exercise when you have recovered physically.  · Do not use drugs or alcohol to manage your emotions.  · Consider seeing a mental health professional to help you recover emotionally.  · Ask a friend or loved one to help you decide what to do with any clothing and nursery items you received for your baby.  In the case of a stillbirth, many women benefit from taking additional steps in the grieving  process. You may want to:  · Hold your baby after the birth.  · Name your baby.  · Request a birth certificate.  · Create a keepsake such as handprints or footprints.  · Dress your baby and have a picture taken.  · Make funeral arrangements.  · Ask for a baptism or blessing.  Hospitals have staff members who can help you with all these arrangements.  How to recognize emotional stress  It is normal to have emotional stress after a pregnancy loss. But emotional stress that lasts a long time or becomes severe requires treatment. Watch out for these signs of severe emotional stress:  · Sadness, anger, or guilt that is not going away and is interfering with your normal activities.  · Relationship problems that have occurred or gotten worse since the pregnancy loss.  · Signs of depression that last longer than 2 weeks. These may include:  ? Sadness.  ? Anxiety.  ? Hopelessness.  ? Loss of interest in activities you enjoy.  ? Inability to concentrate.  ? Trouble sleeping or sleeping too much.  ? Loss of appetite or overeating.  ? Thoughts of death or of hurting yourself.  Follow these instructions at home:  Medicines  · Take over-the-counter and prescription medicines only as told by your health care provider.  Activity  · Rest at home until your energy level returns. Return to your normal activities as told by your health care provider. Ask your health care   provider what activities are safe for you.  General instructions  · Keep all follow-up visits as told by your health care provider. This is important.  · It may be helpful to meet with others who have experienced pregnancy loss. Ask your health care provider about support groups and resources.  · To help you and your partner with the process of grieving, talk with your health care provider or seek counseling.  · When you are ready, meet with your health care provider to discuss steps to take for a future pregnancy.  Where to find more information  · [place] Department of  Health and Human Services Office on Women's Health: www.womenshealth.gov  · American Pregnancy Association: www.americanpregnancy.org  Contact a health care provider if:  · You continue to experience grief, sadness, or lack of motivation for everyday activities, and those feelings do not improve over time.  · You are struggling to recover emotionally, especially if you are using alcohol or substances to help.  Get help right away if:  · You have thoughts of hurting yourself or others.  If you ever feel like you may hurt yourself or others, or have thoughts about taking your own life, get help right away. You can go to your nearest emergency department or call:  · Your local emergency services (911 in the [place]  · A suicide crisis helpline, such as the National Suicide Prevention Lifeline at 1-800-273-8255. This is open 24 hours a day.  Summary  · Any pregnancy loss can be difficult physically and emotionally.  · You may experience many different emotions while you grieve. Emotional recovery can last longer than physical recovery.  · It is normal to have emotional stress after a pregnancy loss. But emotional stress that lasts a long time or becomes severe requires treatment.  · See your health care provider if you are struggling emotionally after a pregnancy loss.  This information is not intended to replace advice given to you by your health care provider. Make sure you discuss any questions you have with your health care provider.  Document Released: 02/25/2018 Document Revised: 02/25/2018 Document Reviewed: 02/25/2018  Elsevier Interactive Patient Education © 2019 [employer]

## 2019-01-17 NOTE — Telephone Encounter (Signed)
Called the patient to schedule the appointment. Patient agreed to attend.

## 2019-01-18 NOTE — Progress Notes (Signed)
Patient seen and assessed by nursing staff.  Agree with documentation and plan.  

## 2019-01-20 ENCOUNTER — Other Ambulatory Visit: Payer: Self-pay

## 2019-01-24 ENCOUNTER — Other Ambulatory Visit: Payer: Self-pay

## 2019-01-24 ENCOUNTER — Other Ambulatory Visit: Payer: Self-pay | Admitting: *Deleted

## 2019-01-24 DIAGNOSIS — O021 Missed abortion: Secondary | ICD-10-CM

## 2019-01-25 ENCOUNTER — Telehealth: Payer: Self-pay | Admitting: Emergency Medicine

## 2019-01-25 LAB — BETA HCG QUANT (REF LAB): hCG Quant: 8437 m[IU]/mL

## 2019-01-25 NOTE — Telephone Encounter (Signed)
Pt called and left a message on the nurse voicemail line requesting results from blood work done on 1/27.   After reviewing lab results, pt phone call was returned. Pt was informed that her HCG level had declined appropriately. Pt had questions regarding further testing and pt was told that there would possibly be labs drawn at her next visit on 2/3 to make sure levels were still declining. Pt verbalized understanding and stated she had no more questions at this time.

## 2019-01-31 ENCOUNTER — Encounter: Payer: Self-pay | Admitting: Family Medicine

## 2019-01-31 ENCOUNTER — Encounter: Payer: Self-pay | Admitting: Advanced Practice Midwife

## 2019-01-31 ENCOUNTER — Ambulatory Visit (INDEPENDENT_AMBULATORY_CARE_PROVIDER_SITE_OTHER): Payer: Self-pay | Admitting: Advanced Practice Midwife

## 2019-01-31 VITALS — BP 119/68 | HR 98 | Ht 65.0 in | Wt 154.3 lb

## 2019-01-31 DIAGNOSIS — O039 Complete or unspecified spontaneous abortion without complication: Secondary | ICD-10-CM

## 2019-01-31 DIAGNOSIS — Z9889 Other specified postprocedural states: Secondary | ICD-10-CM

## 2019-01-31 NOTE — Progress Notes (Signed)
  Subjective:     Patient ID: Joy White, female   DOB: 30-Dec-1995, 23 y.o.   MRN: [id number]  Joy White is a 23 y.o. G3P0030 here for follow up after SAB that began 01/12/18. She continues to have bleeding that soaks a heavy flow pad q2-4hrs and has a "strong odor," as well as intermittent pelvic cramps that she rates 4/10. She has become more nauseated over the past 3 days, but also reports constipation.     Review of Systems  Constitutional: Negative for appetite change, fatigue and fever.  HENT: Negative.   Eyes: Negative.  Negative for photophobia and visual disturbance.  Respiratory: Negative.  Negative for shortness of breath.   Cardiovascular: Negative.  Negative for chest pain.  Gastrointestinal: Positive for abdominal pain (some upper abd pain (mild)), constipation and nausea. Negative for abdominal distention, blood in stool, diarrhea and vomiting.  Endocrine: Negative.   Genitourinary: Positive for pelvic pain and vaginal bleeding. Negative for dysuria, flank pain and frequency.  Musculoskeletal: Negative.   Skin: Negative.   Neurological: Negative for dizziness, weakness, light-headedness and headaches.  Hematological: Negative.   Psychiatric/Behavioral: Negative.        Objective:   Physical Exam Vitals signs and nursing note reviewed.  Constitutional:      General: She is not in acute distress.    Appearance: Normal appearance. She is not ill-appearing or diaphoretic.  HENT:     Head: Normocephalic.  Cardiovascular:     Rate and Rhythm: Normal rate and regular rhythm.     Heart sounds: Normal heart sounds. No murmur.  Pulmonary:     Effort: Pulmonary effort is normal. No respiratory distress.     Breath sounds: Normal breath sounds.  Abdominal:     General: Abdomen is flat. Bowel sounds are normal. There is no distension.     Palpations: Abdomen is soft. There is no mass.     Tenderness: There is no abdominal tenderness.  Skin:    General: Skin is  warm and dry.     Coloration: Skin is not pale.  Neurological:     Mental Status: She is alert and oriented to person, place, and time.  Psychiatric:        Mood and Affect: Mood normal.        Behavior: Behavior normal.        Thought Content: Thought content normal.        Judgment: Judgment normal.        Assessment:     1. SAB (spontaneous abortion)    Plan:     - OB TVUS less than 14wks on Thursday 02/03/19 with FU RN visit  - Quantitative Hcg today

## 2019-01-31 NOTE — Progress Notes (Signed)
Has been feeling a lot more nauseous the last few days, not sure why.

## 2019-01-31 NOTE — Patient Instructions (Signed)
Miscarriage  A miscarriage is the loss of an unborn baby (fetus) before the 20th week of pregnancy.  Follow these instructions at home:  Medicines    · Take over-the-counter and prescription medicines only as told by your doctor.  · If you were prescribed antibiotic medicine, take it as told by your doctor. Do not stop taking the antibiotic even if you start to feel better.  · Do not take NSAIDs unless your doctor says that this is safe for you. NSAIDs include aspirin and ibuprofen. These medicines can cause bleeding.  Activity  · Rest as directed. Ask your doctor what activities are safe for you.  · Have someone help you at home during this time.  General instructions  · Write down how many pads you use each day and how soaked they are.  · Watch the amount of tissue or clumps of blood (blood clots) that you pass from your vagina. Save any large amounts of tissue for your doctor.  · Do not use tampons, douche, or have sex until your doctor approves.  · To help you and your partner with the process of grieving, talk with your doctor or seek counseling.  · When you are ready, meet with your doctor to talk about steps you should take for your health. Also, talk with your doctor about steps to take to have a healthy pregnancy in the future.  · Keep all follow-up visits as told by your doctor. This is important.  Contact a doctor if:  · You have a fever or chills.  · You have vaginal discharge that smells bad.  · You have more bleeding.  Get help right away if:  · You have very bad cramps or pain in your back or belly.  · You pass clumps of blood that are walnut-sized or larger from your vagina.  · You pass tissue that is walnut-sized or larger from your vagina.  · You soak more than 1 regular pad in an hour.  · You get light-headed or weak.  · You faint (pass out).  · You have feelings of sadness that do not go away, or you have thoughts of hurting yourself.  Summary  · A miscarriage is the loss of an unborn baby before  the 20th week of pregnancy.  · Follow your doctor's instructions for home care. Keep all follow-up appointments.  · To help you and your partner with the process of grieving, talk with your doctor or seek counseling.  This information is not intended to replace advice given to you by your health care provider. Make sure you discuss any questions you have with your health care provider.  Document Released: 03/08/2012 Document Revised: 01/20/2017 Document Reviewed: 01/20/2017  Elsevier Interactive Patient Education © 2019 Elsevier Inc.

## 2019-02-01 ENCOUNTER — Other Ambulatory Visit: Payer: Self-pay

## 2019-02-01 ENCOUNTER — Ambulatory Visit (HOSPITAL_COMMUNITY)
Admission: RE | Admit: 2019-02-01 | Discharge: 2019-02-01 | Disposition: A | Discharge status: Home / Self Care | Payer: Self-pay | Source: Ambulatory Visit | Attending: Advanced Practice Midwife | Admitting: Advanced Practice Midwife

## 2019-02-01 ENCOUNTER — Encounter (HOSPITAL_BASED_OUTPATIENT_CLINIC_OR_DEPARTMENT_OTHER): Payer: Self-pay | Admitting: *Deleted

## 2019-02-01 ENCOUNTER — Ambulatory Visit (INDEPENDENT_AMBULATORY_CARE_PROVIDER_SITE_OTHER): Payer: Self-pay | Admitting: General Practice

## 2019-02-01 DIAGNOSIS — Z712 Person consulting for explanation of examination or test findings: Secondary | ICD-10-CM

## 2019-02-01 DIAGNOSIS — O039 Complete or unspecified spontaneous abortion without complication: Secondary | ICD-10-CM | POA: Insufficient documentation

## 2019-02-01 LAB — BETA HCG QUANT (REF LAB): hCG Quant: 3678 m[IU]/mL

## 2019-02-01 LAB — CBC
Hematocrit: 25.1 % — ABNORMAL LOW (ref 34.0–46.6)
Hemoglobin: 8.2 g/dL — ABNORMAL LOW (ref 11.1–15.9)
MCH: 27.8 pg (ref 26.6–33.0)
MCHC: 32.7 g/dL (ref 31.5–35.7)
MCV: 85 fL (ref 79–97)
Platelets: 355 10*3/uL (ref 150–450)
RBC: 2.95 x10E6/uL — ABNORMAL LOW (ref 3.77–5.28)
RDW: 16 % — ABNORMAL HIGH (ref 11.7–15.4)
WBC: 11.5 10*3/uL — ABNORMAL HIGH (ref 3.4–10.8)

## 2019-02-01 NOTE — Progress Notes (Signed)
Patient presents to office today for ultrasound results following recent SAB. Discussed with Rhett Bannister who will review results/plan of care with patient.  Chase Caller RN BSN 02/01/19

## 2019-02-01 NOTE — Progress Notes (Signed)
Briefly, Joy White is a 22yo G3P0030 who is about 2 weeks from first trimester loss. Received cytotec x 2 for management. States has had heavy period for last week, much heavier bleeding yesterday. Had ultrasound today which showed concern for retained POC. BhCG yesterday [id number]. Message sent to [place] to have patient scheduled for [facility] as soon as possible. Counseled patient on procedure risks/benefits. All questions answered.   Cristal Deer. Earlene Plater, DO OB/GYN Fellow

## 2019-02-02 ENCOUNTER — Other Ambulatory Visit: Payer: Self-pay | Admitting: Obstetrics & Gynecology

## 2019-02-02 ENCOUNTER — Ambulatory Visit (HOSPITAL_BASED_OUTPATIENT_CLINIC_OR_DEPARTMENT_OTHER): Payer: Self-pay | Admitting: Anesthesiology

## 2019-02-02 ENCOUNTER — Encounter (HOSPITAL_BASED_OUTPATIENT_CLINIC_OR_DEPARTMENT_OTHER): Payer: Self-pay

## 2019-02-02 ENCOUNTER — Ambulatory Visit (HOSPITAL_BASED_OUTPATIENT_CLINIC_OR_DEPARTMENT_OTHER)
Admission: RE | Admit: 2019-02-02 | Discharge: 2019-02-02 | Disposition: A | Discharge status: Home / Self Care | Payer: Self-pay | Attending: Obstetrics & Gynecology | Admitting: Obstetrics & Gynecology

## 2019-02-02 ENCOUNTER — Encounter (HOSPITAL_BASED_OUTPATIENT_CLINIC_OR_DEPARTMENT_OTHER)
Admission: RE | Disposition: A | Discharge status: Home / Self Care | Payer: Self-pay | Source: Home / Self Care | Attending: Obstetrics & Gynecology

## 2019-02-02 DIAGNOSIS — O034 Incomplete spontaneous abortion without complication: Secondary | ICD-10-CM | POA: Diagnosis present

## 2019-02-02 DIAGNOSIS — Z79899 Other long term (current) drug therapy: Secondary | ICD-10-CM | POA: Insufficient documentation

## 2019-02-02 DIAGNOSIS — O021 Missed abortion: Secondary | ICD-10-CM

## 2019-02-02 DIAGNOSIS — Z3A09 9 weeks gestation of pregnancy: Secondary | ICD-10-CM | POA: Insufficient documentation

## 2019-02-02 DIAGNOSIS — Z791 Long term (current) use of non-steroidal anti-inflammatories (NSAID): Secondary | ICD-10-CM | POA: Insufficient documentation

## 2019-02-02 HISTORY — PX: DILATION AND EVACUATION: SHX1459

## 2019-02-02 SURGERY — DILATION AND EVACUATION, UTERUS
Anesthesia: Monitor Anesthesia Care | Site: Uterus

## 2019-02-02 MED ORDER — PROPOFOL 500 MG/50ML IV EMUL
INTRAVENOUS | Status: DC | PRN
  Administered 2019-02-02: 200 ug/kg/min via INTRAVENOUS

## 2019-02-02 MED ORDER — ONDANSETRON HCL 4 MG/2ML IJ SOLN
INTRAMUSCULAR | Status: DC | PRN
  Administered 2019-02-02: 4 mg via INTRAVENOUS

## 2019-02-02 MED ORDER — IBUPROFEN 600 MG PO TABS: 600.0000 mg | ORAL_TABLET | Freq: Four times a day (QID) | ORAL | 1 refills | Status: DC | PRN

## 2019-02-02 MED ORDER — PROPOFOL 10 MG/ML IV BOLUS
INTRAVENOUS | Status: DC | PRN
  Administered 2019-02-02 (×2): 20 mg via INTRAVENOUS

## 2019-02-02 MED ORDER — LACTATED RINGERS IV SOLN
INTRAVENOUS | Status: DC
  Administered 2019-02-02: 09:00:00 via INTRAVENOUS

## 2019-02-02 MED ORDER — LIDOCAINE HCL (CARDIAC) PF 100 MG/5ML IV SOSY
PREFILLED_SYRINGE | INTRAVENOUS | Status: DC | PRN
  Administered 2019-02-02: 30 mg via INTRAVENOUS

## 2019-02-02 MED ORDER — MIDAZOLAM HCL 2 MG/2ML IJ SOLN
INTRAMUSCULAR | Status: AC
  Filled 2019-02-02: qty 2

## 2019-02-02 MED ORDER — DOXYCYCLINE HYCLATE 100 MG IV SOLR
200.0000 mg | INTRAVENOUS | Status: AC
  Administered 2019-02-02: 200 mg via INTRAVENOUS
  Filled 2019-02-02: qty 200

## 2019-02-02 MED ORDER — SCOPOLAMINE 1 MG/3DAYS TD PT72: 1.0000 | MEDICATED_PATCH | Freq: Once | TRANSDERMAL | Status: DC | PRN

## 2019-02-02 MED ORDER — MIDAZOLAM HCL 2 MG/2ML IJ SOLN
1.0000 mg | INTRAMUSCULAR | Status: DC | PRN
  Administered 2019-02-02: 2 mg via INTRAVENOUS

## 2019-02-02 MED ORDER — ONDANSETRON HCL 4 MG/2ML IJ SOLN: 4.0000 mg | Freq: Once | INTRAMUSCULAR | Status: DC | PRN

## 2019-02-02 MED ORDER — BUPIVACAINE-EPINEPHRINE 0.5% -1:200000 IJ SOLN
INTRAMUSCULAR | Status: DC | PRN
  Administered 2019-02-02: 10 mL

## 2019-02-02 MED ORDER — FENTANYL CITRATE (PF) 100 MCG/2ML IJ SOLN
INTRAMUSCULAR | Status: AC
  Filled 2019-02-02: qty 2

## 2019-02-02 MED ORDER — FENTANYL CITRATE (PF) 100 MCG/2ML IJ SOLN
50.0000 ug | INTRAMUSCULAR | Status: DC | PRN
  Administered 2019-02-02: 100 ug via INTRAVENOUS

## 2019-02-02 MED ORDER — MEPERIDINE HCL 25 MG/ML IJ SOLN: 6.2500 mg | INTRAMUSCULAR | Status: DC | PRN

## 2019-02-02 MED ORDER — HYDROMORPHONE HCL 1 MG/ML IJ SOLN: 0.2500 mg | INTRAMUSCULAR | Status: DC | PRN

## 2019-02-02 SURGICAL SUPPLY — 21 items
BRIEF STRETCH FOR OB PAD XXL (UNDERPADS AND DIAPERS) ×3 IMPLANT
CATH ROBINSON RED A/P 14FR (CATHETERS) ×3 IMPLANT
DECANTER SPIKE VIAL GLASS SM (MISCELLANEOUS) IMPLANT
GLOVE BIO SURGEON STRL SZ 6.5 (GLOVE) ×2 IMPLANT
GLOVE BIO SURGEONS STRL SZ 6.5 (GLOVE) ×1
GLOVE BIOGEL PI IND STRL 7.0 (GLOVE) ×2 IMPLANT
GLOVE BIOGEL PI INDICATOR 7.0 (GLOVE) ×4
GOWN STRL REUS W/ TWL LRG LVL3 (GOWN DISPOSABLE) ×1 IMPLANT
GOWN STRL REUS W/TWL LRG LVL3 (GOWN DISPOSABLE) ×8 IMPLANT
HIBICLENS CHG 4% 4OZ BTL (MISCELLANEOUS) IMPLANT
NS IRRIG 1000ML POUR BTL (IV SOLUTION) ×3 IMPLANT
PACK VAGINAL MINOR WOMEN LF (CUSTOM PROCEDURE TRAY) ×3 IMPLANT
PAD OB MATERNITY 4.3X12.25 (PERSONAL CARE ITEMS) ×3 IMPLANT
PAD PREP 24X48 CUFFED NSTRL (MISCELLANEOUS) ×3 IMPLANT
SET BERKELEY SUCTION TUBING (SUCTIONS) ×3 IMPLANT
TOWEL OR 17X24 6PK STRL BLUE (TOWEL DISPOSABLE) ×3 IMPLANT
TRAP TISSUE FILTER (MISCELLANEOUS) ×3 IMPLANT
VACURETTE 10 RIGID CVD (CANNULA) ×3 IMPLANT
VACURETTE 7MM CVD STRL WRAP (CANNULA) IMPLANT
VACURETTE 8 RIGID CVD (CANNULA) IMPLANT
VACURETTE 9 RIGID CVD (CANNULA) IMPLANT

## 2019-02-02 NOTE — Discharge Instructions (Signed)
Incomplete Miscarriage A miscarriage is the loss of an unborn baby (fetus) before the 20th week of pregnancy. In an incomplete miscarriage, parts of the fetus or placenta (afterbirth) remain in the body. Most miscarriages happen in the first 3 months of pregnancy. Sometimes, it happens before a woman even knows she is pregnant. Having a miscarriage can be an emotional experience. If you have had a miscarriage, talk with your health care provider about any questions you may have about miscarrying, the grieving process, and your future pregnancy plans. What are the causes? This condition may be caused by:  Problems with the genes or chromosomes that make it impossible for the baby to develop normally. These problems are most often the result of random errors that occur early in development, and are not passed from parent to child (not inherited).  Infection of the cervix or uterus.  Conditions that affect hormone balance in the body.  Problems with the cervix, such as the cervix opening and thinning before pregnancy is at term (cervical insufficiency).  Problems with the uterus, such as a uterus with an abnormal shape, fibroids in the uterus, or problems that were present from birth (congenital abnormalities).  Certain medical conditions.  Smoking, drinking alcohol, or using drugs.  Injury (trauma). Many times, the cause of a miscarriage is not known. What are the signs or symptoms? Symptoms of this condition include:  Vaginal bleeding or spotting, with or without cramps or pain.  Pain or cramping in the abdomen or lower back.  Passing fluid, tissue, or blood clots from the vagina. How is this diagnosed? This condition may be diagnosed based on:  A physical exam.  Ultrasound.  Blood tests.  Urine tests. How is this treated? An incomplete miscarriage may be treated with:  Dilation and curettage (D&C). This is a procedure in which the cervix is stretched open and the lining of  the uterus (endometrium) is scraped to remove any remaining tissue from the pregnancy.  Medicines, such as: ? Antibiotic medicine to treat infection. ? Medicine to help any remaining tissue pass out of your uterus. ? Medicine to reduce (contract) the size of the uterus. These medicines may be given if you have a lot of bleeding. If you have Rh negative blood and your baby was Rh positive, you will need a shot of medicine called Rh immunoglobulinto protect future babies from Rh blood problems. "Rh-negative" and "Rh-positive" refer to whether or not the blood has a specific protein found on the surface of red blood cells (Rh factor). Follow these instructions at home: Medicines   Take over-the-counter and prescription medicines only as told by your health care provider.  If you were prescribed antibiotic medicine, take your antibiotic as told by your health care provider. Do not stop taking the antibiotic even if you start to feel better.  Do not take NSAIDs, such as aspirin and ibuprofen, unless approved by your doctor. These medicines can cause bleeding. Activity  Rest as directed. Ask your health care provider what activities are safe for you.  Have someone help with home and family responsibilities during this time. General instructions  Keep track of the number of sanitary pads you use each day and how soaked (saturated) they are. Write down this information.  Monitor the amount of tissue or blood clots that you pass from your vagina. Save any large amounts of tissue for your health care provider to examine.  Do not use tampons, douche, or have sex until your health care provider   approves.  To help you and your partner with the process of grieving, talk with your health care provider or seek counseling to help cope with the pregnancy loss.  When you are ready, meet with your health care provider to discuss important steps you should take for your health, as well as steps to take in  order to have a healthy pregnancy in the future.  Keep all follow-up visits as told by your health care provider. This is important. Where to find more information  The American Congress of Obstetricians and Gynecologists: www.acog.org  U.S. Department of Health and Cytogeneticist of Womens Health: http://hoffman.com/ Contact a health care provider if:  You have a fever or chills.  You have a foul smelling vaginal discharge. Get help right away if:  You have severe cramps or pain in your back or abdomen.  You pass walnut-sized (or larger) blood clots or tissue from your vagina.  You have heavy bleeding, soaking more than 1 regular sanitary pad in an hour.  You become lightheaded or weak.  You pass out.  You have feelings of sadness that take over your thoughts, or you have thoughts of hurting yourself. Summary  In an incomplete miscarriage, parts of the fetus or placenta (afterbirth) remain in the body.  There are multiple treatment options for an incomplete miscarriage, talk to your health care provider about the best option for you.  Follow your health care provider's instructions for follow-up care.  To help you and your partner with the process of grieving, talk with your health care provider or seek counseling to help cope with the pregnancy loss. This information is not intended to replace advice given to you by your health care provider. Make sure you discuss any questions you have with your health care provider. Document Released: 12/15/2005 Document Revised: 01/21/2017 Document Reviewed: 01/21/2017 Elsevier Interactive Patient Education  2019 Elsevier Inc.  Dilation and Curettage or Vacuum Curettage, Care After These instructions give you information about caring for yourself after your procedure. Your doctor may also give you more specific instructions. Call your doctor if you have any problems or questions after your procedure. Follow these instructions  at home: Activity  Do not drive or use heavy machinery while taking prescription pain medicine.  For 24 hours after your procedure, avoid driving.  Take short walks often, followed by rest periods. Ask your doctor what activities are safe for you. After one or two days, you may be able to return to your normal activities.  Do not lift anything that is heavier than 10 lb (4.5 kg) until your doctor approves.  For at least 2 weeks, or as long as told by your doctor: ? Do not douche. ? Do not use tampons. ? Do not have sex. General instructions   Take over-the-counter and prescription medicines only as told by your doctor. This is very important if you take blood thinning medicine.  Do not take baths, swim, or use a hot tub until your doctor approves. Take showers instead of baths.  Wear compression stockings as told by your doctor.  It is up to you to get the results of your procedure. Ask your doctor when your results will be ready.  Keep all follow-up visits as told by your doctor. This is important. Contact a doctor if:  You have very bad cramps that get worse or do not get better with medicine.  You have very bad pain in your belly (abdomen).  You cannot drink fluids  without throwing up (vomiting).  You get pain in a different part of the area between your belly and thighs (pelvis).  You have bad-smelling discharge from your vagina.  You have a rash. Get help right away if:  You are bleeding a lot from your vagina. A lot of bleeding means soaking more than one sanitary pad in an hour, for 2 hours in a row.  You have clumps of blood (blood clots) coming from your vagina.  You have a fever or chills.  Your belly feels very tender or hard.  You have chest pain.  You have trouble breathing.  You cough up blood.  You feel dizzy.  You feel light-headed.  You pass out (faint).  You have pain in your neck or shoulder area. Summary  Take short walks often,  followed by rest periods. Ask your doctor what activities are safe for you. After one or two days, you may be able to return to your normal activities.  Do not lift anything that is heavier than 10 lb (4.5 kg) until your doctor approves.  Do not take baths, swim, or use a hot tub until your doctor approves. Take showers instead of baths.  Contact your doctor if you have any symptoms of infection, like bad-smelling discharge from your vagina. This information is not intended to replace advice given to you by your health care provider. Make sure you discuss any questions you have with your health care provider. Document Released: 09/23/2008 Document Revised: 09/01/2016 Document Reviewed: 09/01/2016 Elsevier Interactive Patient Education  2019 [employer].     Post Anesthesia Home Care Instructions  Activity: Get plenty of rest for the remainder of the day. A responsible individual must stay with you for 24 hours following the procedure.  For the next 24 hours, DO NOT: -Drive a car -Advertising copywriter -Drink alcoholic beverages -Take any medication unless instructed by your physician -Make any legal decisions or sign important papers.  Meals: Start with liquid foods such as gelatin or soup. Progress to regular foods as tolerated. Avoid greasy, spicy, heavy foods. If nausea and/or vomiting occur, drink only clear liquids until the nausea and/or vomiting subsides. Call your physician if vomiting continues.  Special Instructions/Symptoms: Your throat may feel dry or sore from the anesthesia or the breathing tube placed in your throat during surgery. If this causes discomfort, gargle with warm salt water. The discomfort should disappear within 24 hours.  If you had a scopolamine patch placed behind your ear for the management of post- operative nausea and/or vomiting:  1. The medication in the patch is effective for 72 hours, after which it should be removed.  Wrap patch in a tissue and  discard in the trash. Wash hands thoroughly with soap and water. 2. You may remove the patch earlier than 72 hours if you experience unpleasant side effects which may include dry mouth, dizziness or visual disturbances. 3. Avoid touching the patch. Wash your hands with soap and water after contact with the patch.

## 2019-02-02 NOTE — Transfer of Care (Signed)
Immediate Anesthesia Transfer of Care Note  Patient: Joy White  Procedure(s) Performed: DILATATION AND EVACUATION (N/A Uterus)  Patient Location: PACU  Anesthesia Type:MAC  Level of Consciousness: awake, alert  and oriented  Airway & Oxygen Therapy: Patient Spontanous Breathing and Patient connected to face mask oxygen  Post-op Assessment: Report given to RN and Post -op Vital signs reviewed and stable  Post vital signs: Reviewed and stable  Last Vitals:  Vitals Value Taken Time  BP    Temp    Pulse 96 02/02/2019 10:18 AM  Resp    SpO2 100 % 02/02/2019 10:18 AM  Vitals shown include unvalidated device data.  Last Pain:  Vitals:   02/02/19 0837  TempSrc: Oral  PainSc: 0-No pain         Complications: No apparent anesthesia complications

## 2019-02-02 NOTE — Anesthesia Preprocedure Evaluation (Signed)
Anesthesia Evaluation  Patient identified by MRN, date of birth, ID band Patient awake    Reviewed: Allergy & Precautions, NPO status , Patient's Chart, lab work & pertinent test results  Airway Mallampati: I  TM Distance: >3 FB Neck ROM: Full    Dental   Pulmonary    Pulmonary exam normal        Cardiovascular Normal cardiovascular exam     Neuro/Psych    GI/Hepatic   Endo/Other    Renal/GU      Musculoskeletal   Abdominal   Peds  Hematology   Anesthesia Other Findings   Reproductive/Obstetrics                             Anesthesia Physical Anesthesia Plan  ASA: II  Anesthesia Plan: MAC   Post-op Pain Management:    Induction: Intravenous  PONV Risk Score and Plan: 2 and Ondansetron and Midazolam  Airway Management Planned: Simple Face Mask  Additional Equipment:   Intra-op Plan:   Post-operative Plan:   Informed Consent: I have reviewed the patients History and Physical, chart, labs and discussed the procedure including the risks, benefits and alternatives for the proposed anesthesia with the patient or authorized representative who has indicated his/her understanding and acceptance.       Plan Discussed with: CRNA and Surgeon  Anesthesia Plan Comments:         Anesthesia Quick Evaluation

## 2019-02-02 NOTE — Op Note (Signed)
[facility] PROCEDURE DATE: 02/02/2019  PREOPERATIVE DIAGNOSIS: 9.5 week missed abortion POSTOPERATIVE DIAGNOSIS: The same PROCEDURE:  Dilation and Evacuation SURGEON:  Scheryl Darter MD  INDICATIONS: 23 y.o. G3P0030 with MAB at 9.[redacted] weeks gestation, needing surgical completion.  Risks of surgery were discussed with the patient including but not limited to: bleeding which may require transfusion; infection which may require antibiotics; injury to uterus or surrounding organs; need for additional procedures including laparotomy or laparoscopy; possibility of intrauterine scarring which may impair future fertility; and other postoperative/anesthesia complications. Written informed consent was obtained.    FINDINGS:  A 8 week size uterus, moderate amounts of products of conception, specimen sent to pathology.  ANESTHESIA:    Monitored intravenous sedation, paracervical block. INTRAVENOUS FLUIDS:  500 ml of LR ESTIMATED BLOOD LOSS:  Less than 20 ml. SPECIMENS:  Products of conception sent to pathology COMPLICATIONS:  None immediate.  PROCEDURE DETAILS:  The patient received intravenous Doxycycline while in the preoperative area.  She was then taken to the operating room where monitored intravenous sedation was administered and was found to be adequate.  After an adequate timeout was performed, she was placed in the dorsal lithotomy position and examined; then prepped and draped in the sterile manner.   Her bladder was catheterized for an unmeasured amount of clear, yellow urine. A vaginal speculum was then placed in the patient's vagina and a single tooth tenaculum was applied to the anterior lip of the cervix.  A paracervical block using 10 ml of 0.5% Marcaine was administered. The cervix was gently dilated to accommodate a 10 mm suction curette that was gently advanced to the uterine fundus.  The suction device was then activated and curette slowly rotated to clear the uterus of products of  conception. Complete emptying of the uterus was confirmed. There was minimal bleeding noted and the tenaculum removed with good hemostasis noted.   All instruments were removed from the patient's vagina.  Sponge and instrument counts were correct times two  The patient tolerated the procedure well and was taken to the recovery area awake, and in stable condition.  Adam Phenix, MD 02/02/2019 10:20 AM

## 2019-02-02 NOTE — H&P (Signed)
Joy White is an 23 y.o. female. [id number] Joy White has received cytotec twice for incomplete abortion and US is concerning for retained POC. She is scheduled for D&C today.    Menstrual History:  No LMP recorded.    Past Medical History:  Diagnosis Date  . Anemia     Past Surgical History:  Procedure Laterality Date  . DILATION AND CURETTAGE OF UTERUS      History reviewed. No pertinent family history.  Social History:  reports that she has never smoked. She has never used smokeless tobacco. She reports previous alcohol use. She reports current drug use. Drug: Marijuana.  Allergies: No Known Allergies  Medications Prior to Admission  Medication Sig Dispense Refill Last Dose  . ibuprofen (ADVIL,MOTRIN) 800 MG tablet Take 1 tablet (800 mg total) by mouth 3 (three) times daily. 30 tablet 0 Past Week at Joy time  . promethazine (PHENERGAN) 25 MG tablet Take 1 tablet (25 mg total) by mouth every 6 (six) hours as needed for nausea or vomiting. 30 tablet 0 Past Week at Joy time  . traMADol (ULTRAM) 50 MG tablet Take 1 tablet (50 mg total) by mouth every 6 (six) hours as needed for severe pain. 60 tablet 0 Past Month at Joy time    ROS  Height 5\' 5"  (1.651 m), weight 70 kg, Joy if currently breastfeeding. Physical Exam  Vitals reviewed. Constitutional: She is oriented to person, place, and time. She appears well-developed. No distress.  Cardiovascular: Normal rate.  Respiratory: Effort normal. No respiratory distress.  GI: Soft.  Neurological: She is alert and oriented to person, place, and time.  Psychiatric: She has a normal mood and affect.    No results found for this or any previous visit (from the past 24 hour(s)).  [place] [facility] Transvaginal  Result Date: 02/01/2019 CLINICAL DATA:  Spontaneous abortion. Rule out retained products of conception. EXAM: TRANSVAGINAL OB ULTRASOUND TECHNIQUE: Transvaginal ultrasound was performed for complete evaluation  of the gestation as well as the maternal uterus, adnexal regions, and pelvic cul-de-sac. COMPARISON:  01/16/2019 FINDINGS: Intrauterine gestational sac: None Yolk sac:  Not visualized Embryo:  Not visualized Cardiac Activity: Heart Rate:  bpm MSD:   mm    w     d CRL:     mm    w  d                  US EDC: Subchorionic hemorrhage:  None visualized. Maternal uterus/adnexae: Heterogeneous material noted in the region of the lower uterine segment and cervix contains blood flow concerning for retained products of conception. IMPRESSION: No intrauterine pregnancy. Heterogeneous soft tissue in the lower uterine segment and cervical regions of the endometrium with internal blood flow concerning for retained products of conception. Electronically Signed   By: Charlett NoseKevin  Dover M.D.   On: 02/01/2019 10:57    Assessment/Plan: White desires surgical management with suction dilation and curettage.  The risks of surgery were discussed in detail with the White including but not limited to: bleeding which may require transfusion or reoperation; infection which may require prolonged hospitalization or re-hospitalization and antibiotic therapy; injury to bowel, bladder, ureters and major vessels or other surrounding organs; need for additional procedures including laparotomy; thromboembolic phenomenon, incisional problems and other postoperative or anesthesia complications.  White was told that the likelihood that her condition and symptoms will be treated effectively with this surgical management was very high; the postoperative expectations were also discussed in detail. The White also understands  the alternative treatment options which were discussed in full. All questions were answered.     Scheryl Darter 02/02/2019, 8:34 AM

## 2019-02-03 ENCOUNTER — Encounter (HOSPITAL_BASED_OUTPATIENT_CLINIC_OR_DEPARTMENT_OTHER): Payer: Self-pay | Admitting: Obstetrics & Gynecology

## 2019-02-03 ENCOUNTER — Ambulatory Visit (HOSPITAL_COMMUNITY): Payer: Self-pay

## 2019-02-03 NOTE — Anesthesia Postprocedure Evaluation (Signed)
Anesthesia Post Note  Patient: Joy White  Procedure(s) Performed: DILATATION AND EVACUATION (N/A Uterus)     Patient location during evaluation: PACU Anesthesia Type: MAC Level of consciousness: awake and alert Pain management: pain level controlled Vital Signs Assessment: post-procedure vital signs reviewed and stable Respiratory status: spontaneous breathing, nonlabored ventilation, respiratory function stable and patient connected to nasal cannula oxygen Cardiovascular status: stable and blood pressure returned to baseline Postop Assessment: no apparent nausea or vomiting Anesthetic complications: no    Last Vitals:  Vitals:   02/02/19 1100 02/02/19 1130  BP: 117/71 117/70  Pulse: 72 97  Resp: 13 18  Temp:  36.5 C  SpO2: 100% 98%    Last Pain:  Vitals:   02/02/19 1130  TempSrc:   PainSc: 0-No pain                 Joy White DAVID

## 2019-02-17 ENCOUNTER — Ambulatory Visit (INDEPENDENT_AMBULATORY_CARE_PROVIDER_SITE_OTHER): Payer: Self-pay | Admitting: Obstetrics & Gynecology

## 2019-02-17 ENCOUNTER — Encounter: Payer: Self-pay | Admitting: Obstetrics & Gynecology

## 2019-02-17 VITALS — BP 126/70 | HR 82 | Ht 65.0 in | Wt 157.3 lb

## 2019-02-17 DIAGNOSIS — O034 Incomplete spontaneous abortion without complication: Secondary | ICD-10-CM

## 2019-02-17 NOTE — Patient Instructions (Signed)
Contraception Choices  Contraception, also called birth control, refers to methods or devices that prevent pregnancy.  Hormonal methods  Contraceptive implant    A contraceptive implant is a thin, plastic tube that contains a hormone. It is inserted into the upper part of the arm. It can remain in place for up to 3 years.  Progestin-only injections  Progestin-only injections are injections of progestin, a synthetic form of the hormone progesterone. They are given every 3 months by a health care provider.  Birth control pills    Birth control pills are pills that contain hormones that prevent pregnancy. They must be taken once a day, preferably at the same time each day.  Birth control patch    The birth control patch contains hormones that prevent pregnancy. It is placed on the skin and must be changed once a week for three weeks and removed on the fourth week. A prescription is needed to use this method of contraception.  Vaginal ring    A vaginal ring contains hormones that prevent pregnancy. It is placed in the vagina for three weeks and removed on the fourth week. After that, the process is repeated with a new ring. A prescription is needed to use this method of contraception.  Emergency contraceptive  Emergency contraceptives prevent pregnancy after unprotected sex. They come in pill form and can be taken up to 5 days after sex. They work best the sooner they are taken after having sex. Most emergency contraceptives are available without a prescription. This method should not be used as your only form of birth control.  Barrier methods  Female condom    A female condom is a thin sheath that is worn over the penis during sex. Condoms keep sperm from going inside a woman's body. They can be used with a spermicide to increase their effectiveness. They should be disposed after a single use.  Female condom    A female condom is a soft, loose-fitting sheath that is put into the vagina before sex. The condom keeps sperm  from going inside a woman's body. They should be disposed after a single use.  Diaphragm    A diaphragm is a soft, dome-shaped barrier. It is inserted into the vagina before sex, along with a spermicide. The diaphragm blocks sperm from entering the uterus, and the spermicide kills sperm. A diaphragm should be left in the vagina for 6-8 hours after sex and removed within 24 hours.  A diaphragm is prescribed and fitted by a health care provider. A diaphragm should be replaced every 1-2 years, after giving birth, after gaining more than 15 lb (6.8 kg), and after pelvic surgery.  Cervical cap    A cervical cap is a round, soft latex or plastic cup that fits over the cervix. It is inserted into the vagina before sex, along with spermicide. It blocks sperm from entering the uterus. The cap should be left in place for 6-8 hours after sex and removed within 48 hours. A cervical cap must be prescribed and fitted by a health care provider. It should be replaced every 2 years.  Sponge    A sponge is a soft, circular piece of polyurethane foam with spermicide on it. The sponge helps block sperm from entering the uterus, and the spermicide kills sperm. To use it, you make it wet and then insert it into the vagina. It should be inserted before sex, left in for at least 6 hours after sex, and removed and thrown away within   30 hours.  Spermicides  Spermicides are chemicals that kill or block sperm from entering the cervix and uterus. They can come as a cream, jelly, suppository, foam, or tablet. A spermicide should be inserted into the vagina with an applicator at least 10-15 minutes before sex to allow time for it to work. The process must be repeated every time you have sex. Spermicides do not require a prescription.  Intrauterine contraception  Intrauterine device (IUD)  An IUD is a T-shaped device that is put in a woman's uterus. There are two types:   Hormone IUD.This type contains progestin, a synthetic form of the hormone  progesterone. This type can stay in place for 3-5 years.   Copper IUD.This type is wrapped in copper wire. It can stay in place for 10 years.    Permanent methods of contraception  Female tubal ligation  In this method, a woman's fallopian tubes are sealed, tied, or blocked during surgery to prevent eggs from traveling to the uterus.  Hysteroscopic sterilization  In this method, a small, flexible insert is placed into each fallopian tube. The inserts cause scar tissue to form in the fallopian tubes and block them, so sperm cannot reach an egg. The procedure takes about 3 months to be effective. Another form of birth control must be used during those 3 months.  Female sterilization  This is a procedure to tie off the tubes that carry sperm (vasectomy). After the procedure, the man can still ejaculate fluid (semen).  Natural planning methods  Natural family planning  In this method, a couple does not have sex on days when the woman could become pregnant.  Calendar method  This means keeping track of the length of each menstrual cycle, identifying the days when pregnancy can happen, and not having sex on those days.  Ovulation method  In this method, a couple avoids sex during ovulation.  Symptothermal method  This method involves not having sex during ovulation. The woman typically checks for ovulation by watching changes in her temperature and in the consistency of cervical mucus.  Post-ovulation method  In this method, a couple waits to have sex until after ovulation.  Summary   Contraception, also called birth control, means methods or devices that prevent pregnancy.   Hormonal methods of contraception include implants, injections, pills, patches, vaginal rings, and emergency contraceptives.   Barrier methods of contraception can include female condoms, female condoms, diaphragms, cervical caps, sponges, and spermicides.   There are two types of IUDs (intrauterine devices). An IUD can be put in a woman's uterus to  prevent pregnancy for 3-5 years.   Permanent sterilization can be done through a procedure for males, females, or both.   Natural family planning methods involve not having sex on days when the woman could become pregnant.  This information is not intended to replace advice given to you by your health care provider. Make sure you discuss any questions you have with your health care provider.  Document Released: 12/15/2005 Document Revised: 12/17/2017 Document Reviewed: 01/17/2017  Elsevier Interactive Patient Education  2019 Elsevier Inc.

## 2019-02-17 NOTE — Progress Notes (Signed)
Subjective:     Joy White is a 23 y.o. female who presents to the clinic 3 weeks status post suction dilation and curettage for incomplete abortion. Eating a regular diet without difficulty. Bowel movements are normal. The patient is not having any pain.  The following portions of the patient's history were reviewed and updated as appropriate: allergies, current medications, past family history, past medical history, past social history, past surgical history and problem list.  Review of Systems Pertinent items are noted in HPI.    Objective:    BP 126/70   Pulse 82   Ht 5\' 5"  (1.651 m)   Wt 157 lb 4.8 oz (71.4 kg)   BMI 26.18 kg/m  General:  alert, cooperative and no distress  Abdomen: No distention  Incision:   {n/a     Assessment:    Doing well postoperatively. Operative findings again reviewed. Pathology report discussed.    Plan:    1. Continue any current medications. 2. Wound care discussed. 3. Activity restrictions: none 4. Anticipated return to work: now. 5. Follow up: as needed. Contraception information was given  Adam Phenix, MD 02/17/2019 2:57 PM

## 2019-02-25 ENCOUNTER — Telehealth: Payer: Self-pay | Admitting: *Deleted

## 2019-02-25 NOTE — Telephone Encounter (Signed)
Joy White left a message this am that she had a D&C 02/02/19 and that a couple of days ago she started spotting.States she was told it would be 6 weeks before she bled- wants to know if this is her period ?

## 2019-03-01 NOTE — Telephone Encounter (Signed)
Called patient stating I am returning her phone call. Patient states Thursday or Friday she started to have spotting, initially it was brown and now it's more like pink spotting. Patient states she is unsure if this is her period or not. Told patient it very likely is and bleeding will likely increase over the next couple of days. Told patient if spotting persists she may call us back. Patient verbalized understanding & had no questions.

## 2020-01-13 ENCOUNTER — Other Ambulatory Visit: Payer: Self-pay

## 2020-01-13 ENCOUNTER — Inpatient Hospital Stay (HOSPITAL_COMMUNITY)
Admission: AD | Admit: 2020-01-13 | Discharge: 2020-01-13 | Disposition: A | Discharge status: Home / Self Care | Payer: Medicaid Other | Attending: Obstetrics and Gynecology | Admitting: Obstetrics and Gynecology

## 2020-01-13 ENCOUNTER — Encounter (HOSPITAL_COMMUNITY): Payer: Self-pay | Admitting: Obstetrics and Gynecology

## 2020-01-13 DIAGNOSIS — Z3481 Encounter for supervision of other normal pregnancy, first trimester: Secondary | ICD-10-CM | POA: Insufficient documentation

## 2020-01-13 DIAGNOSIS — Z3491 Encounter for supervision of normal pregnancy, unspecified, first trimester: Secondary | ICD-10-CM

## 2020-01-13 DIAGNOSIS — Z3A10 10 weeks gestation of pregnancy: Secondary | ICD-10-CM | POA: Diagnosis not present

## 2020-01-13 NOTE — MAU Provider Note (Signed)
First Provider Initiated Contact with Patient 01/13/20 1202      S Joy White is a 24 y.o. G93P0030 pregnant female @[redacted]w[redacted]d  who presents to MAU today with complaint of pregnancy confirmation. Pt denies pain or bleeding.  O BP 131/68 (BP Location: Right Arm)   Pulse 90   Temp 98.6 F (37 C) (Oral)   Resp 16   Ht 5\' 5"  (1.651 m)   Wt 72.6 kg   LMP 11/03/2019   SpO2 100%   BMI 26.64 kg/m  Physical Exam  Constitutional: She is oriented to person, place, and time. She appears well-developed and well-nourished. No distress.  HENT:  Head: Normocephalic and atraumatic.  Respiratory: Effort normal.  Neurological: She is alert and oriented to person, place, and time.  Skin: She is not diaphoretic.  Psychiatric: She has a normal mood and affect. Her behavior is normal. Judgment and thought content normal.   A Pregnant female @[redacted]w[redacted]d  by LMP FHR 161  Medical screening exam complete  P Discharge from MAU in stable condition Pregnancy confirmation letter given by RN List of options for follow-up given  Warning signs for worsening condition that would warrant emergency follow-up discussed Patient may return to MAU as needed for pregnancy related complaints  Nayquan Evinger, , NP 01/13/2020 12:06 PM

## 2020-01-13 NOTE — MAU Note (Signed)
Needs proof of pregnancy. Tried to set up prenatal appt, was told had to have verification.  Has [place] report from [facility]. No pain or bleeding.

## 2020-02-03 ENCOUNTER — Ambulatory Visit (INDEPENDENT_AMBULATORY_CARE_PROVIDER_SITE_OTHER): Payer: Self-pay | Admitting: *Deleted

## 2020-02-03 ENCOUNTER — Other Ambulatory Visit: Payer: Self-pay

## 2020-02-03 DIAGNOSIS — O09899 Supervision of other high risk pregnancies, unspecified trimester: Secondary | ICD-10-CM | POA: Insufficient documentation

## 2020-02-03 DIAGNOSIS — Z87440 Personal history of urinary (tract) infections: Secondary | ICD-10-CM | POA: Insufficient documentation

## 2020-02-03 DIAGNOSIS — Z3490 Encounter for supervision of normal pregnancy, unspecified, unspecified trimester: Secondary | ICD-10-CM

## 2020-02-03 DIAGNOSIS — Z87448 Personal history of other diseases of urinary system: Secondary | ICD-10-CM | POA: Insufficient documentation

## 2020-02-03 NOTE — Patient Instructions (Signed)

## 2020-02-03 NOTE — Progress Notes (Addendum)
I connected with  Joy White on 02/03/20 at  9:30 AM EST by telephone and verified that I am speaking with the correct person using two identifiers.   I discussed the limitations, risks, security and privacy concerns of performing an evaluation and management service by telephone and the availability of in person appointments. I also discussed with the patient that there may be a patient responsible charge related to this service. The patient expressed understanding and agreed to proceed.  I explained I am completing her New OB Intake today. We discussed Her EDD and that it is based on  sure LMP . I reviewed her allergies, meds, OB History, Medical /Surgical history, and appropriate screenings. I informed her of [facility] services.  I explained I will send her the Babyscripts app- app sent to her while on phone.  I explained we will send a blood pressure cuff to [facility] that will fill that prescription once her medicaid is approved. I explained  then we will have her take her blood pressure weekly and enter into the app. I explained she will have some visits in office and some virtually. I sent her a MyChart text and she signed up and downloaded the  MyChart app. I reviewed her new ob  appointment date/ time with her , our location and to wear mask, no visitors.  I explained she will have a pelvic exam, ob bloodwork, hemoglobin a1C, cbg ,pap, and  genetic testing if desired,- she does want a panorama. I scheduled an [place] at 19 weeks and gave her the appointment. She voices understanding.   Zohaib Heeney,RN 02/03/2020  9:33 AM   Chart reviewed for nurse visit. Agree with plan of care.   Currie Paris, NP 02/03/2020 12:43 PM

## 2020-02-13 ENCOUNTER — Encounter: Payer: Self-pay | Admitting: Family Medicine

## 2020-02-13 ENCOUNTER — Other Ambulatory Visit: Payer: Self-pay

## 2020-02-13 ENCOUNTER — Encounter: Payer: Self-pay | Admitting: Nurse Practitioner

## 2020-02-13 ENCOUNTER — Ambulatory Visit (INDEPENDENT_AMBULATORY_CARE_PROVIDER_SITE_OTHER): Payer: Self-pay | Admitting: Nurse Practitioner

## 2020-02-13 VITALS — BP 114/67 | HR 79 | Wt 162.7 lb

## 2020-02-13 DIAGNOSIS — Z113 Encounter for screening for infections with a predominantly sexual mode of transmission: Secondary | ICD-10-CM

## 2020-02-13 DIAGNOSIS — Z3492 Encounter for supervision of normal pregnancy, unspecified, second trimester: Secondary | ICD-10-CM

## 2020-02-13 DIAGNOSIS — O09299 Supervision of pregnancy with other poor reproductive or obstetric history, unspecified trimester: Secondary | ICD-10-CM

## 2020-02-13 DIAGNOSIS — K5901 Slow transit constipation: Secondary | ICD-10-CM

## 2020-02-13 NOTE — Patient Instructions (Addendum)
Safe Medications in Pregnancy   Acne: Benzoyl Peroxide Salicylic Acid  Backache/Headache: Tylenol: 2 regular strength every 4 hours OR              2 Extra strength every 6 hours  Colds/Coughs/Allergies: Benadryl (alcohol free) 25 mg every 6 hours as needed Breath right strips Claritin Cepacol throat lozenges Chloraseptic throat spray Cold-Eeze- up to three times per day Cough drops, alcohol free Flonase  Guaifenesin Mucinex Robitussin DM (plain only, alcohol free) Saline nasal spray/drops Sudafed (pseudoephedrine) & Actifed ** use only after [redacted] weeks gestation and if you do not have high blood pressure Tylenol Vicks Vaporub Zinc lozenges Zyrtec   Constipation: Colace Ducolax suppositories Fleet enema Glycerin suppositories Metamucil Milk of magnesia Miralax Senokot Smooth move tea  Diarrhea: Kaopectate Imodium A-D  *NO pepto Bismol  Hemorrhoids: Anusol Anusol HC Preparation H Tucks  Indigestion: Tums Maalox Mylanta Zantac  Pepcid  Insomnia: Benadryl (alcohol free) 25mg  every 6 hours as needed Tylenol PM Unisom, no Gelcaps  Leg Cramps: Tums MagGel  Nausea/Vomiting:  Bonine Dramamine Emetrol Ginger extract Sea bands Meclizine  Nausea medication to take during pregnancy:  Unisom (doxylamine succinate 25 mg tablets) Take one tablet daily at bedtime. If symptoms are not adequately controlled, the dose can be increased to a maximum recommended dose of two tablets daily (1/2 tablet in the morning, 1/2 tablet mid-afternoon and one at bedtime). Vitamin B6 100mg  tablets. Take one tablet twice a day (up to 200 mg per day).  Skin Rashes: Aveeno products Benadryl cream or 25mg  every 6 hours as needed Calamine Lotion 1% cortisone cream  Yeast infection: Gyne-lotrimin 7 Monistat 7   **If taking multiple medications, please check labels to avoid duplicating the same active ingredients **take medication as directed on the label ** Do not  exceed 4000 mg of tylenol in 24 hours **Do not take medications that contain aspirin or ibuprofen    Constipation, Adult Constipation is when a person:  Poops (has a bowel movement) fewer times in a week than normal.  Has a hard time pooping.  Has poop that is dry, hard, or bigger than normal. Follow these instructions at home: Eating and drinking   Eat foods that have a lot of fiber, such as: ? Fresh fruits and vegetables. ? Whole grains. ? Beans.  Eat less of foods that are high in fat, low in fiber, or overly processed, such as: ? [place] fries. ? Hamburgers. ? Cookies. ? Candy. ? Soda.  Drink enough fluid to keep your pee (urine) clear or pale yellow. General instructions  Exercise regularly or as told by your doctor.  Go to the restroom when you feel like you need to poop. Do not hold it in.  Take over-the-counter and prescription medicines only as told by your doctor. These include any fiber supplements.  Do pelvic floor retraining exercises, such as: ? Doing deep breathing while relaxing your lower belly (abdomen). ? Relaxing your pelvic floor while pooping.  Watch your condition for any changes.  Keep all follow-up visits as told by your doctor. This is important. Contact a doctor if:  You have pain that gets worse.  You have a fever.  You have not pooped for 4 days.  You throw up (vomit).  You are not hungry.  You lose weight.  You are bleeding from the anus.  You have thin, pencil-like poop (stool). Get help right away if:  You have a fever, and your symptoms suddenly get worse.  You leak  poop or have blood in your poop.  Your belly feels hard or bigger than normal (is bloated).  You have very bad belly pain.  You feel dizzy or you faint. This information is not intended to replace advice given to you by your health care provider. Make sure you discuss any questions you have with your health care provider. Document Revised: 11/27/2017  Document Reviewed: 06/04/2016 Elsevier Patient Education  2020 [employer].

## 2020-02-13 NOTE — Progress Notes (Signed)
Gave Pt BP Cuff & also educated on how to operate it.  Medicaid Home Form Completed-02/13/20

## 2020-02-13 NOTE — Progress Notes (Signed)
Subjective:   Joy White is a 24 y.o. G4P0030 at [redacted]w[redacted]d by early ultrasound being seen today for her first obstetrical visit.  Her obstetrical history is significant for previous miscarriage, history of frequent UTIs, and history of pyelonephritis not associated with pregnancy. Patient does intend to breast feed. Pregnancy history fully reviewed.  Patient reports no complaints.  HISTORY: OB History  Gravida Para Term Preterm AB Living  4 0 0 0 3 0  SAB TAB Ectopic Multiple Live Births  1 2 0 0 0    # Outcome Date GA Lbr Len/2nd Weight Sex Delivery Anes PTL Lv  4 Current           3 SAB 12/2018 [redacted]w[redacted]d            Birth Comments: had D&E  2 TAB 2019          1 TAB 2014           Past Medical History:  Diagnosis Date  . Anemia   . Bronchitis 2014  . History of recurrent UTIs   . Pyelonephritis 11/2017   Past Surgical History:  Procedure Laterality Date  . DILATION AND CURETTAGE OF UTERUS    . DILATION AND EVACUATION N/A 02/02/2019   Procedure: DILATATION AND EVACUATION;  Surgeon: Woodroe Mode, MD;  Location: [facility];  Service: Gynecology;  Laterality: N/A;  . WISDOM TOOTH EXTRACTION  2008   Family History  Problem Relation Age of Onset  . Hyperlipidemia Mother   . Hypertension Mother   . Heart disease Father    Social History   Tobacco Use  . Smoking status: Never Smoker  . Smokeless tobacco: Never Used  Substance Use Topics  . Alcohol use: Not Currently    Comment: socially  . Drug use: Not Currently    Types: Marijuana    Comment: last used 11/29/19   No Known Allergies Current Outpatient Medications on File Prior to Visit  Medication Sig Dispense Refill  . acetaminophen (TYLENOL) 500 MG tablet Take 500 mg by mouth every 6 (six) hours as needed.    . Prenatal Vit-Fe Fumarate-FA (PRENATAL VITAMINS PO) Take 1 tablet by mouth daily.     No current facility-administered medications on file prior to visit.     Exam   Vitals:   02/13/20 1012  BP: 114/67  Pulse: 79  Weight: 162 lb 11.2 oz (73.8 kg)   Fetal Heart Rate (bpm): 147  Uterus:  Fundal Height: 14 cm  Pelvic Exam: Perineum: no hemorrhoids, normal perineum   Vulva: normal external genitalia, no lesions   Vagina:  normal mucosa, normal discharge   Cervix: no lesions and normal, pap smear done.    Adnexa: normal adnexa and no mass, fullness, tenderness   Bony Pelvis: average  System: General: well-developed, well-nourished female in no acute distress   Breast:  normal appearance, no masses or tenderness   Skin: normal coloration and turgor, no rashes   Neurologic: oriented, normal, negative, normal mood   Extremities: normal strength, tone, and muscle mass, ROM of all joints is normal   HEENT extraocular movement intact and sclera clear, anicteric   Mouth/Teeth deferred   Neck supple and no masses, normal thyroid   Cardiovascular: regular rate and rhythm   Respiratory:  no respiratory distress, normal breath sounds   Abdomen: soft, non-tender; no masses,  no organomegaly     Assessment:   Pregnancy: G4P0030 Patient Active Problem List   Diagnosis Date  Noted  . Slow transit constipation 02/13/2020  . Supervision of low-risk pregnancy 02/03/2020  . History of pyelonephritis 02/03/2020  . History of recurrent UTIs      Plan:  1. Encounter for supervision of low-risk pregnancy in second trimester Advised online breastfeeding and childbirth classes later in pregnancy Reviewed babyscripts and MyChart app Has prenatal vitamins  - Culture, OB Urine - Flu Vaccine QUAD 36+ mos IM - Genetic Screening - Obstetric Panel, Including HIV - Hemoglobin A1c - Cytology - PAP( [place])  2. Slow transit constipation Reviewed dietary measures for constipation  List of meds that can be used in pregnancy given Discussed using Miralax   Initial labs drawn. Continue prenatal vitamins. Genetic Screening discussed, NIPS: ordered. Ultrasound discussed;  fetal anatomic survey: ordered. Problem list reviewed and updated. The nature of [facility] with multiple MDs and other Advanced Practice Providers was explained to patient; also emphasized that residents, students are part of our team. Routine obstetric precautions reviewed. Return in about 4 weeks (around 03/12/2020) for Virtual ROB.  Total face-to-face time with patient: 40 minutes.  Over 50% of encounter was spent on counseling and coordination of care.     Nolene Bernheim, FNP Family Nurse Practitioner, [facility] for [employer], [facility] 02/13/2020 9:41 PM

## 2020-02-14 ENCOUNTER — Encounter: Payer: Self-pay | Admitting: *Deleted

## 2020-02-15 LAB — OBSTETRIC PANEL, INCLUDING HIV
Antibody Screen: NEGATIVE
Basophils Absolute: 0 10*3/uL (ref 0.0–0.2)
Basos: 1 %
EOS (ABSOLUTE): 0.1 10*3/uL (ref 0.0–0.4)
Eos: 1 %
HIV Screen 4th Generation wRfx: NONREACTIVE
Hematocrit: 31.8 % — ABNORMAL LOW (ref 34.0–46.6)
Hemoglobin: 9.5 g/dL — ABNORMAL LOW (ref 11.1–15.9)
Hepatitis B Surface Ag: NEGATIVE
Immature Grans (Abs): 0 10*3/uL (ref 0.0–0.1)
Immature Granulocytes: 0 %
Lymphocytes Absolute: 2.5 10*3/uL (ref 0.7–3.1)
Lymphs: 35 %
MCH: 21.9 pg — ABNORMAL LOW (ref 26.6–33.0)
MCHC: 29.9 g/dL — ABNORMAL LOW (ref 31.5–35.7)
MCV: 73 fL — ABNORMAL LOW (ref 79–97)
Monocytes Absolute: 0.7 10*3/uL (ref 0.1–0.9)
Monocytes: 10 %
Neutrophils Absolute: 4 10*3/uL (ref 1.4–7.0)
Neutrophils: 53 %
Platelets: 317 10*3/uL (ref 150–450)
RBC: 4.34 x10E6/uL (ref 3.77–5.28)
RDW: 20.5 % — ABNORMAL HIGH (ref 11.7–15.4)
RPR Ser Ql: NONREACTIVE
Rh Factor: POSITIVE
Rubella Antibodies, IGG: 2.12 index (ref >0.99)
WBC: 7.3 10*3/uL (ref 3.4–10.8)

## 2020-02-15 LAB — CYTOLOGY - PAP
Chlamydia: NEGATIVE
Comment: NEGATIVE
Comment: NORMAL
Diagnosis: NEGATIVE
Neisseria Gonorrhea: NEGATIVE

## 2020-02-15 LAB — CULTURE, OB URINE

## 2020-02-15 LAB — HEMOGLOBIN A1C
Est. average glucose Bld gHb Est-mCnc: 105 mg/dL
Hgb A1c MFr Bld: 5.3 % (ref 4.8–5.6)

## 2020-02-15 LAB — URINE CULTURE, OB REFLEX

## 2020-02-16 ENCOUNTER — Encounter: Payer: Self-pay | Admitting: Nurse Practitioner

## 2020-02-16 DIAGNOSIS — O99019 Anemia complicating pregnancy, unspecified trimester: Secondary | ICD-10-CM | POA: Insufficient documentation

## 2020-03-05 ENCOUNTER — Encounter: Payer: Self-pay | Admitting: *Deleted

## 2020-03-13 ENCOUNTER — Ambulatory Visit (INDEPENDENT_AMBULATORY_CARE_PROVIDER_SITE_OTHER): Payer: Self-pay | Admitting: Student

## 2020-03-13 ENCOUNTER — Encounter: Payer: Self-pay | Admitting: Student

## 2020-03-13 ENCOUNTER — Other Ambulatory Visit: Payer: Self-pay

## 2020-03-13 VITALS — BP 119/69 | HR 95 | Wt 166.4 lb

## 2020-03-13 DIAGNOSIS — O99012 Anemia complicating pregnancy, second trimester: Secondary | ICD-10-CM

## 2020-03-13 DIAGNOSIS — K5900 Constipation, unspecified: Secondary | ICD-10-CM

## 2020-03-13 DIAGNOSIS — M545 Low back pain, unspecified: Secondary | ICD-10-CM

## 2020-03-13 DIAGNOSIS — Z3492 Encounter for supervision of normal pregnancy, unspecified, second trimester: Secondary | ICD-10-CM

## 2020-03-13 DIAGNOSIS — O99612 Diseases of the digestive system complicating pregnancy, second trimester: Secondary | ICD-10-CM

## 2020-03-13 DIAGNOSIS — Z3A18 18 weeks gestation of pregnancy: Secondary | ICD-10-CM

## 2020-03-13 DIAGNOSIS — O26892 Other specified pregnancy related conditions, second trimester: Secondary | ICD-10-CM

## 2020-03-13 MED ORDER — COMFORT FIT MATERNITY SUPP SM MISC: 1.0000 [IU] | Freq: Every day | 0 refills | Status: DC | PRN

## 2020-03-13 NOTE — Progress Notes (Signed)
   PRENATAL VISIT NOTE  Subjective:  Joy White is a 24 y.o. G4P0030 at [redacted]w[redacted]d being seen today for ongoing prenatal care.  She is currently monitored for the following issues for this low-risk pregnancy and has History of recurrent UTIs; Supervision of low-risk pregnancy; History of pyelonephritis; Slow transit constipation; and Anemia in pregnancy on their problem list.  Patient reports constipation & back pain. .  Contractions: Not present. Vag. Bleeding: None.  Movement: Present. Denies leaking of fluid.   The following portions of the patient's history were reviewed and updated as appropriate: allergies, current medications, past family history, past medical history, past social history, past surgical history and problem list.   Objective:   Vitals:   03/13/20 1121  BP: 119/69  Pulse: 95  Weight: 166 lb 6.4 oz (75.5 kg)    Fetal Status: Fetal Heart Rate (bpm): 150   Movement: Present    Fundal height 1 FB below umbilicus.   General:  Alert, oriented and cooperative. Patient is in no acute distress.  Skin: Skin is warm and dry. No rash noted.   Cardiovascular: Normal heart rate noted  Respiratory: Normal respiratory effort, no problems with respiration noted  Abdomen: Soft, gravid, appropriate for gestational age.  Pain/Pressure: Present     Pelvic: Cervical exam deferred        Extremities: Normal range of motion.  Edema: Trace  Mental Status: Normal mood and affect. Normal behavior. Normal judgment and thought content.   Assessment and Plan:  Pregnancy: G4P0030 at [redacted]w[redacted]d 1. Encounter for supervision of low-risk pregnancy in second trimester -anatomy ultrasound scheduled for tomorrow.  -doing well. Reviewed upcoming visits. Given childbirth education info. May be interested in waterbirth. Will give waterbirth info & schedule with CNM - AFP, Serum, Open Spina Bifida  2. Low back pain during pregnancy in second trimester -some low back pain intermittently. Requesting  maternity support belt.  - Elastic Bandages & Supports (COMFORT FIT MATERNITY SUPP SM) MISC; 1 Units by Does not apply route daily as needed.  Dispense: 1 each; Refill: 0  3. Anemia during pregnancy in second trimester -start taking iron supplement. Will reassess at 28 wk visit  4. Constipation during pregnancy in second trimester -fiber & water intake -colace BID every day & miralax as needed  Preterm labor symptoms and general obstetric precautions including but not limited to vaginal bleeding, contractions, leaking of fluid and fetal movement were reviewed in detail with the patient. Please refer to After Visit Summary for other counseling recommendations.   Return in about 4 weeks (around 04/10/2020) for Routine OB, virtual.  Future Appointments  Date Time Provider Department Center  03/14/2020  9:30 AM [facility] [place] 1 [facility]    Judeth Horn, NP

## 2020-03-13 NOTE — Patient Instructions (Addendum)
BIO tech Address: [place], [place], [place] [address]  Phone: [phone]       Childbirth Education Options: [facility] Department Classes:  Childbirth education classes can help you get ready for a positive parenting experience. You can also meet other expectant parents and get free stuff for your baby. Each class runs for five weeks on the same night and costs $45 for the mother-to-be and her support person. Medicaid covers the cost if you are eligible. Call [phone] to register. [facility] Childbirth Education:  [phone] or [phone] or [email or web address]  Baby & Me Class: Discuss newborn & infant parenting and family adjustment issues with other new mothers in a relaxed environment. Each week brings a new speaker or baby-centered activity. We encourage new mothers to join [place] every Thursday at 11:00am. Babies birth until crawling. No registration or fee. Daddy [employer]: This course offers Dads-to-be the tools and knowledge needed to feel confident on their journey to becoming new fathers. Experienced dads, who have been trained as coaches, teach dads-to-be how to hold, comfort, diaper, swaddle and play with their infant while being able to support the new mom as well. A class for men taught by men. $25/dad Big Brother/Big Sister: Let your children share in the joy of a new brother or sister in this special class designed just for them. Class includes discussion about how families care for babies: swaddling, holding, diapering, safety as well as how they can be helpful in their new role. This class is designed for children ages 24 to 27, but any age is welcome. Please register each child individually. $5/child  Mom Talk: This mom-led group offers support and connection to mothers as they journey through the adjustments and struggles of that sometimes overwhelming first year after the birth of a child. Tuesdays at 10:00am and Thursdays at 6:00pm. Babies  welcome. No registration or fee. Breastfeeding Support Group: This group is a mother-to-mother support circle where moms have the opportunity to share their breastfeeding experiences. A Lactation Consultant is present for questions and concerns. Meets each Tuesday at 11:00am. No fee or registration. Breastfeeding Your Baby: Learn what to expect in the first days of breastfeeding your newborn.  This class will help you feel more confident with the skills needed to begin your breastfeeding experience. Many new mothers are concerned about breastfeeding after leaving the hospital. This class will also address the most common fears and challenges about breastfeeding during the first few weeks, months and beyond. (call for fee) Comfort Techniques and Tour: This 2 hour interactive class will provide you the opportunity to learn & practice hands-on techniques that can help relieve some of the discomfort of labor and encourage your baby to rotate toward the best position for birth. You and your partner will be able to try a variety of labor positions with birth balls and rebozos as well as practice breathing, relaxation, and visualization techniques. A tour of the [facility] is included with this class. $20 per registrant and support person Childbirth Class- Weekend Option: This class is a Weekend version of our Birth & Baby series. It is designed for parents who have a difficult time fitting several weeks of classes into their schedule. It covers the care of your newborn and the basics of labor and childbirth. It also includes a [place] [facility] and lunch. The class is held two consecutive days: beginning on Friday evening from 6:30 - 8:30 p.m. and the  next day, Saturday from 9 a.m. - 4 p.m. (call for fee) Waterbirth Class: Interested in a waterbirth?  This informational class will help you discover whether waterbirth is the right fit for you. Education about  waterbirth itself, supplies you would need and how to assemble your support team is what you can expect from this class. Some obstetrical practices require this class in order to pursue a waterbirth. (Not all obstetrical practices offer waterbirth-check with your healthcare provider.) Register only the expectant mom, but you are encouraged to bring your partner to class! Required if planning waterbirth, no fee. Infant/Child CPR: Parents, grandparents, babysitters, and friends learn Cardio-Pulmonary Resuscitation skills for infants and children. You will also learn how to treat both conscious and unconscious choking in infants and children. This Family & Friends program does not offer certification. Register each participant individually to ensure that enough mannequins are available. (Call for fee) Grandparent Love: Expecting a grandbaby? This class is for you! Learn about the latest infant care and safety recommendations and ways to support your own child as he or she transitions into the parenting role. Taught by Registered Nurses who are childbirth instructors, but most importantly...they are grandmothers too! $10/person. Childbirth Class- Natural Childbirth: This series of 5 weekly classes is for expectant parents who want to learn and practice natural methods of coping with the process of labor and childbirth. Relaxation, breathing, massage, visualization, role of the partner, and helpful positioning are highlighted. Participants learn how to be confident in their body's ability to give birth. This class will empower and help parents make informed decisions about their own care. Includes discussion that will help new parents transition into the immediate postpartum period. [facility] is included. We suggest taking this class between 25-32 weeks, but it's only a recommendation. $75 per registrant and one support person or $30 Medicaid. Childbirth Class- 3 week Series: This  option of 3 weekly classes helps you and your labor partner prepare for childbirth. Newborn care, labor & birth, cesarean birth, pain management, and comfort techniques are discussed and a [place] [facility] is included. The class meets at the same time, on the same day of the week for 3 consecutive weeks beginning with the starting date you choose. $60 for registrant and one support person.  Marvelous Multiples: Expecting twins, triplets, or more? This class covers the differences in labor, birth, parenting, and breastfeeding issues that face multiples' parents. NICU tour is included. Led by a Certified Childbirth Educator who is the mother of twins. No fee. Caring for Baby: This class is for expectant and adoptive parents who want to learn and practice the most up-to-date newborn care for their babies. Focus is on birth through the first six weeks of life. Topics include feeding, bathing, diapering, crying, umbilical cord care, circumcision care and safe sleep. Parents learn to recognize symptoms of illness and when to call the pediatrician. Register only the mom-to-be and your partner or support person can plan to come with you! $10 per registrant and support person Childbirth Class- online option: This online class offers you the freedom to complete a Birth and Baby series in the comfort of your own home. The flexibility of this option allows you to review sections at your own pace, at times convenient to you and your support people. It includes additional video information, animations, quizzes, and extended activities. Get organized with helpful eClass tools, checklists, and trackers. Once you register online for the class,  you will receive an email within a few days to accept the invitation and begin the class when the time is right for you. The content will be available to you for 60 days. $60 for 60 days of online access for you and your support people.  Local  Doulas: Natural Baby Doulas [email or web address] Tel: [phone] [email or web address] [employer] [phone] [email or web address] The Labor Hassell Halim  (also do waterbirth tub rental) [phone] [email or web address] [facility] Birth Doula [phone] [email or web address].fi Sacred Rhythms  [phone] [email or web address]-[email or web address]/ [employer] (PADA) [email or web address] [place] Birth [facility] Baby  [email or web address] Considering Waterbirth? Guide for patients at [facility] [employer]  Why consider waterbirth?  . Gentle birth for babies . Less pain medicine used in labor . May allow for passive descent/less pushing . May reduce perineal tears  . More mobility and instinctive maternal position changes . Increased maternal relaxation . Reduced blood pressure in labor  Is waterbirth safe? What are the risks of infection, drowning or other complications?  . Infection: o Very low risk (3.7 % for tub vs 4.8% for bed) o 7 in 8000 waterbirths with documented infection o Poorly cleaned equipment most common cause o Slightly lower group B strep transmission rate  . Drowning o Maternal:  - Very low risk   - Related to seizures or fainting o Newborn:  - Very low risk. No evidence of increased risk of respiratory problems in multiple large studies - Physiological protection from breathing under water - Avoid underwater birth if there are any fetal complications - Once baby's head is out of the water, keep it out.  . Birth complication o Some reports of cord trauma, but risk decreased by bringing baby to surface gradually o No evidence of increased risk of shoulder dystocia. Mothers can usually change positions faster in water than in a bed, possibly aiding the maneuvers to free  the shoulder.   You must attend a Doren Custard class at [facility]  3rd Wednesday of every month from 7-9pm  [employer] by calling [phone] or online at [email or web address]  Bring [place] the certificate from the class to your prenatal appointment  Meet with a midwife at 36 weeks to see if you can still plan a waterbirth and to sign the consent.   Purchase or rent the following supplies:   Water Birth Pool (Birth Pool in a Box or [place] for instance)  (Tubs start ~$125)  Single-use disposable tub liner designed for your brand of tub  New garden hose labeled "lead-free", "suitable for drinking water",  Electric drain pump to remove water (We recommend 792 gallon per hour or greater pump.)   Separate garden hose to remove the dirty water  Fish net  Bathing suit top (optional)  Long-handled mirror (optional)  Places to purchase or rent supplies  GotWebTools.is for tub purchases and supplies  Waterbirthsolutions.com for tub purchases and supplies  The Labor Ladies (www.[email or web address]) $275 for tub rental/set-up & take down/kit   [employer] (http://www.fleming.com/.htm) Information regarding doulas (labor support) who provide pool rentals  Our practice has a Birth Pool in a Box tub at the hospital that you may borrow on a first-come-first-served basis. It is your responsibility to to set up, clean and break down the tub. We cannot guarantee the availability of this tub in advance. You are responsible for bringing all accessories listed above. If you do not have all necessary supplies you cannot have a waterbirth.    Things that would prevent you from  having a waterbirth:  Premature, <37wks  Previous cesarean birth  Presence of thick meconium-stained fluid  Multiple gestation (Twins, triplets, etc.)  Uncontrolled diabetes or gestational diabetes requiring medication  Hypertension requiring medication or diagnosis of  pre-eclampsia  Heavy vaginal bleeding  Non-reassuring fetal heart rate  Active infection (MRSA, etc.). Group B Strep is NOT a contraindication for  waterbirth.  If your labor has to be induced and induction method requires continuous  monitoring of the baby's heart rate  Other risks/issues identified by your obstetrical provider  Please remember that birth is unpredictable. Under certain unforeseeable circumstances your provider may advise against giving birth in the tub. These decisions will be made on a case-by-case basis and with the safety of you and your baby as our highest priority.      Constipation, Adult Constipation is when a person has fewer bowel movements in a week than normal, has difficulty having a bowel movement, or has stools that are dry, hard, or larger than normal. Constipation may be caused by an underlying condition. It may become worse with age if a person takes certain medicines and does not take in enough fluids. Follow these instructions at home: Eating and drinking  Eat foods that have a lot of fiber, such as fresh fruits and vegetables, whole grains, and beans. Limit foods that are high in fat, low in fiber, or overly processed, such as french fries, hamburgers, cookies, candies, and soda. Drink enough fluid to keep your urine clear or pale yellow. General instructions Exercise regularly or as told by your health care provider. Go to the restroom when you have the urge to go. Do not hold it in. Take over-the-counter and prescription medicines only as told by your health care provider. These include any fiber supplements. Practice pelvic floor retraining exercises, such as deep breathing while relaxing the lower abdomen and pelvic floor relaxation during bowel movements. Watch your condition for any changes. Keep all follow-up visits as told by your health care provider. This is important. Contact a health care provider if: You have pain that gets  worse. You have a fever. You do not have a bowel movement after 4 days. You vomit. You are not hungry. You lose weight. You are bleeding from the anus. You have thin, pencil-like stools. Get help right away if: You have a fever and your symptoms suddenly get worse. You leak stool or have blood in your stool. Your abdomen is bloated. You have severe pain in your abdomen. You feel dizzy or you faint. This information is not intended to replace advice given to you by your health care provider. Make sure you discuss any questions you have with your health care provider. Document Revised: 11/27/2017 Document Reviewed: 06/04/2016 Elsevier Patient Education  [place] Medications in Pregnancy   Acne: Benzoyl Peroxide Salicylic Acid  Backache/Headache: Tylenol: 2 regular strength every 4 hours OR              2 Extra strength every 6 hours  Colds/Coughs/Allergies: Benadryl (alcohol free) 25 mg every 6 hours as needed Breath right strips Claritin Cepacol throat lozenges Chloraseptic throat spray Cold-Eeze- up to three times per day Cough drops, alcohol free Flonase (by prescription only) Guaifenesin Mucinex Robitussin DM (plain only, alcohol free) Saline nasal spray/drops Sudafed (pseudoephedrine) & Actifed ** use only after [redacted] weeks gestation and if you do not have high blood pressure Tylenol Vicks Vaporub Zinc lozenges Zyrtec   Constipation: Colace Ducolax suppositories Fleet  enema Glycerin suppositories Metamucil Milk of magnesia Miralax Senokot Smooth move tea  Diarrhea: Kaopectate Imodium A-D  *NO pepto Bismol  Hemorrhoids: Anusol Anusol HC Preparation H Tucks  Indigestion: Tums Maalox Mylanta Zantac  Pepcid  Insomnia: Benadryl (alcohol free) 61m every 6 hours as needed Tylenol PM Unisom, no Gelcaps  Leg Cramps: Tums MagGel  Nausea/Vomiting:  Bonine Dramamine Emetrol Ginger extract Sea bands Meclizine   Nausea medication to take during pregnancy:  Unisom (doxylamine succinate 25 mg tablets) Take one tablet daily at bedtime. If symptoms are not adequately controlled, the dose can be increased to a maximum recommended dose of two tablets daily (1/2 tablet in the morning, 1/2 tablet mid-afternoon and one at bedtime). Vitamin B6 1054mtablets. Take one tablet twice a day (up to 200 mg per day).  Skin Rashes: Aveeno products Benadryl cream or 2535mvery 6 hours as needed Calamine Lotion 1% cortisone cream  Yeast infection: Gyne-lotrimin 7 Monistat 7  Gum/tooth pain: Anbesol  **If taking multiple medications, please check labels to avoid duplicating the same active ingredients **take medication as directed on the label ** Do not exceed 4000 mg of tylenol in 24 hours **Do not take medications that contain aspirin or ibuprofen

## 2020-03-14 ENCOUNTER — Ambulatory Visit (HOSPITAL_COMMUNITY)
Admission: RE | Admit: 2020-03-14 | Discharge: 2020-03-14 | Disposition: A | Discharge status: Home / Self Care | Payer: Medicaid Other | Source: Ambulatory Visit | Attending: Obstetrics and Gynecology | Admitting: Obstetrics and Gynecology

## 2020-03-14 DIAGNOSIS — Z3490 Encounter for supervision of normal pregnancy, unspecified, unspecified trimester: Secondary | ICD-10-CM | POA: Diagnosis present

## 2020-03-14 DIAGNOSIS — Z363 Encounter for antenatal screening for malformations: Secondary | ICD-10-CM

## 2020-03-14 DIAGNOSIS — Z3A18 18 weeks gestation of pregnancy: Secondary | ICD-10-CM

## 2020-03-15 ENCOUNTER — Ambulatory Visit (HOSPITAL_COMMUNITY): Payer: Self-pay

## 2020-03-15 LAB — AFP, SERUM, OPEN SPINA BIFIDA
AFP MoM: 1.37
AFP Value: 61.6 ng/mL
Gest. Age on Collection Date: 18 weeks
Maternal Age At EDD: 25.8 yr
OSBR Risk 1 IN: 7840
Test Results:: NEGATIVE
Weight: 166 [lb_av]

## 2020-03-20 ENCOUNTER — Encounter: Payer: Self-pay | Admitting: General Practice

## 2020-04-10 ENCOUNTER — Other Ambulatory Visit: Payer: Self-pay

## 2020-04-10 ENCOUNTER — Telehealth (INDEPENDENT_AMBULATORY_CARE_PROVIDER_SITE_OTHER): Payer: Self-pay | Admitting: Obstetrics and Gynecology

## 2020-04-10 DIAGNOSIS — Z3A22 22 weeks gestation of pregnancy: Secondary | ICD-10-CM

## 2020-04-10 DIAGNOSIS — Z87448 Personal history of other diseases of urinary system: Secondary | ICD-10-CM

## 2020-04-10 DIAGNOSIS — Z3492 Encounter for supervision of normal pregnancy, unspecified, second trimester: Secondary | ICD-10-CM

## 2020-04-10 NOTE — Progress Notes (Signed)
   MY CHART VIDEO VIRTUAL OBSTETRICS VISIT ENCOUNTER NOTE  I connected with [name] on 04/10/20 at 11:15 AM EDT by My Chart video at home and verified that I am speaking with the correct person using two identifiers.   I discussed the limitations, risks, security and privacy concerns of performing an evaluation and management service by My Chart video and the availability of in person appointments. I also discussed with the patient that there may be a patient responsible charge related to this service. The patient expressed understanding and agreed to proceed.  Subjective:  Joy White is a 24 y.o. G4P0030 at [redacted]w[redacted]d being followed for ongoing prenatal care.  She is currently monitored for the following issues for this low-risk pregnancy and has History of recurrent UTIs; Supervision of low-risk pregnancy; History of pyelonephritis; Slow transit constipation; and Anemia in pregnancy on their problem list.  Patient reports "feeling lightheaded". She just woke up and has not had anyhing to eat since dinner yesterday. She thinks she will feel better once she is able to eat. Reports fetal movement. Denies any contractions, bleeding or leaking of fluid.   The following portions of the patient's history were reviewed and updated as appropriate: allergies, current medications, past family history, past medical history, past social history, past surgical history and problem list.   Objective:   General:  Alert, oriented and cooperative.   Mental Status: Normal mood and affect perceived. Normal judgment and thought content.  Rest of physical exam deferred due to type of encounter  BP 114/75   Pulse 93   LMP 11/03/2019  **Done by patient's own at home BP cuff  Assessment and Plan:  Pregnancy: G4P0030 at [redacted]w[redacted]d  1. Encounter for supervision of low-risk pregnancy in second trimester - Discussed making sure to eat a proein rich lunch after this video appointment ends. - Stressed the  importance of eating small frequent meals/snacks during pregnancy - Anticipatory guidance for next 2 hr GTT appt   Preterm labor symptoms and general obstetric precautions including but not limited to vaginal bleeding, contractions, leaking of fluid and fetal movement were reviewed in detail with the patient.  I discussed the assessment and treatment plan with the patient. The patient was provided an opportunity to ask questions and all were answered. The patient agreed with the plan and demonstrated an understanding of the instructions. The patient was advised to call back or seek an in-person office evaluation/go to [facility] at [facility] for any urgent or concerning symptoms. Please refer to After Visit Summary for other counseling recommendations.   I provided 5 minutes of non-face-to-face time during this encounter. There was 5 minutes of chart review time spent prior to this encounter. Total time spent = 10 minutes.  Return in about 5 weeks (around 05/15/2020) for Return OB 2hr GTT.  No future appointments.  Raelyn Mora, CNM [facility] [employer], [facility]

## 2020-04-10 NOTE — Progress Notes (Signed)
I connected with  [name] on 04/10/20 at 11:15 AM EDT by telephone and verified that I am speaking with the correct person using two identifiers.   I discussed the limitations, risks, security and privacy concerns of performing an evaluation and management service by telephone and the availability of in person appointments. I also discussed with the patient that there may be a patient responsible charge related to this service. The patient expressed understanding and agreed to proceed.  Janene Madeira Dahlia Nifong, CMA 04/10/2020  11:05 AM

## 2020-04-10 NOTE — Patient Instructions (Signed)
Eating Plan for Pregnant Women While you are pregnant, your body requires additional nutrition to help support your growing baby. You also have a higher need for some vitamins and minerals, such as folic acid, calcium, iron, and vitamin D. Eating a healthy, well-balanced diet is very important for your health and your baby's health. Your need for extra calories varies for the three 3-month segments of your pregnancy (trimesters). For most women, it is recommended to consume:  150 extra calories a day during the first trimester.  300 extra calories a day during the second trimester.  300 extra calories a day during the third trimester. What are tips for following this plan?   Do not try to lose weight or go on a diet during pregnancy.  Limit your overall intake of foods that have "empty calories." These are foods that have little nutritional value, such as sweets, desserts, candies, and sugar-sweetened beverages.  Eat a variety of foods (especially fruits and vegetables) to get a full range of vitamins and minerals.  Take a prenatal vitamin to help meet your additional vitamin and mineral needs during pregnancy, specifically for folic acid, iron, calcium, and vitamin D.  Remember to stay active. Ask your health care provider what types of exercise and activities are safe for you.  Practice good food safety and cleanliness. Wash your hands before you eat and after you prepare raw meat. Wash all fruits and vegetables well before peeling or eating. Taking these actions can help to prevent food-borne illnesses that can be very dangerous to your baby, such as listeriosis. Ask your health care provider for more information about listeriosis. What does 150 extra calories look like? Healthy options that provide 150 extra calories each day could be any of the following:  6-8 oz (170-230 g) of plain low-fat yogurt with  cup of berries.  1 apple with 2 teaspoons (11 g) of peanut butter.  Cut-up  vegetables with  cup (60 g) of hummus.  8 oz (230 mL) or 1 cup of low-fat chocolate milk.  1 stick of string cheese with 1 medium orange.  1 peanut butter and jelly sandwich that is made with one slice of whole-wheat bread and 1 tsp (5 g) of peanut butter. For 300 extra calories, you could eat two of those healthy options each day. What is a healthy amount of weight to gain? The right amount of weight gain for you is based on your BMI before you became pregnant. If your BMI:  Was less than 18 (underweight), you should gain 28-40 lb (13-18 kg).  Was 18-24.9 (normal), you should gain 25-35 lb (11-16 kg).  Was 25-29.9 (overweight), you should gain 15-25 lb (7-11 kg).  Was 30 or greater (obese), you should gain 11-20 lb (5-9 kg). What if I am having twins or multiples? Generally, if you are carrying twins or multiples:  You may need to eat 300-600 extra calories a day.  The recommended range for total weight gain is 25-54 lb (11-25 kg), depending on your BMI before pregnancy.  Talk with your health care provider to find out about nutritional needs, weight gain, and exercise that is right for you. What foods can I eat?  Fruits All fruits. Eat a variety of colors and types of fruit. Remember to wash your fruits well before peeling or eating. Vegetables All vegetables. Eat a variety of colors and types of vegetables. Remember to wash your vegetables well before peeling or eating. Grains All grains. Choose whole grains, such   as whole-wheat bread, oatmeal, or brown rice. Meats and other protein foods Lean meats, including chicken, turkey, fish, and lean cuts of beef, veal, or pork. If you eat fish or seafood, choose options that are higher in omega-3 fatty acids and lower in mercury, such as salmon, herring, mussels, trout, sardines, pollock, shrimp, crab, and lobster. Tofu. Tempeh. Beans. Eggs. Peanut butter and other nut butters. Make sure that all meats, poultry, and eggs are cooked to  food-safe temperatures or "well-done." Two or more servings of fish are recommended each week in order to get the most benefits from omega-3 fatty acids that are found in seafood. Choose fish that are lower in mercury. You can find more information online:  www.fda.gov Dairy Pasteurized milk and milk alternatives (such as almond milk). Pasteurized yogurt and pasteurized cheese. Cottage cheese. Sour cream. Beverages Water. Juices that contain 100% fruit juice or vegetable juice. Caffeine-free teas and decaffeinated coffee. Drinks that contain caffeine are okay to drink, but it is better to avoid caffeine. Keep your total caffeine intake to less than 200 mg each day (which is 12 oz or 355 mL of coffee, tea, or soda) or the limit as told by your health care provider. Fats and oils Fats and oils are okay to include in moderation. Sweets and desserts Sweets and desserts are okay to include in moderation. Seasoning and other foods All pasteurized condiments. The items listed above may not be a complete list of foods and beverages you can eat. Contact a dietitian for more information. What foods are not recommended? Fruits Unpasteurized fruit juices. Vegetables Raw (unpasteurized) vegetable juices. Meats and other protein foods Lunch meats, bologna, hot dogs, or other deli meats. (If you must eat those meats, reheat them until they are steaming hot.) Refrigerated pat, meat spreads from a meat counter, smoked seafood that is found in the refrigerated section of a store. Raw or undercooked meats, poultry, and eggs. Raw fish, such as sushi or sashimi. Fish that have high mercury content, such as tilefish, shark, swordfish, and king mackerel. To learn more about mercury in fish, talk with your health care provider or look for online resources, such as:  www.fda.gov Dairy Raw (unpasteurized) milk and any foods that have raw milk in them. Soft cheeses, such as feta, queso blanco, queso fresco, Brie,  Camembert cheeses, blue-veined cheeses, and Panela cheese (unless it is made with pasteurized milk, which must be stated on the label). Beverages Alcohol. Sugar-sweetened beverages, such as sodas, teas, or energy drinks. Seasoning and other foods Homemade fermented foods and drinks, such as pickles, sauerkraut, or kombucha drinks. (Store-bought pasteurized versions of these are okay.) Salads that are made in a store or deli, such as ham salad, chicken salad, egg salad, tuna salad, and seafood salad. The items listed above may not be a complete list of foods and beverages you should avoid. Contact a dietitian for more information. Where to find more information To calculate the number of calories you need based on your height, weight, and activity level, you can use an online calculator such as:  www.choosemyplate.gov/MyPlatePlan To calculate how much weight you should gain during pregnancy, you can use an online pregnancy weight gain calculator such as:  www.choosemyplate.gov/pregnancy-weight-gain-calculator Summary  While you are pregnant, your body requires additional nutrition to help support your growing baby.  Eat a variety of foods, especially fruits and vegetables to get a full range of vitamins and minerals.  Practice good food safety and cleanliness. Wash your hands before you eat   and after you prepare raw meat. Wash all fruits and vegetables well before peeling or eating. Taking these actions can help to prevent food-borne illnesses, such as listeriosis, that can be very dangerous to your baby.  Do not eat raw meat or fish. Do not eat fish that have high mercury content, such as tilefish, shark, swordfish, and king mackerel. Do not eat unpasteurized (raw) dairy.  Take a prenatal vitamin to help meet your additional vitamin and mineral needs during pregnancy, specifically for folic acid, iron, calcium, and vitamin D. This information is not intended to replace advice given to you by  your health care provider. Make sure you discuss any questions you have with your health care provider. Document Revised: 05/05/2019 Document Reviewed: 09/11/2017 [employer] Patient Education  2020 [employer]  

## 2020-04-24 ENCOUNTER — Encounter: Payer: Self-pay | Admitting: *Deleted

## 2020-05-09 ENCOUNTER — Other Ambulatory Visit: Payer: Self-pay | Admitting: *Deleted

## 2020-05-09 DIAGNOSIS — Z3490 Encounter for supervision of normal pregnancy, unspecified, unspecified trimester: Secondary | ICD-10-CM

## 2020-05-15 ENCOUNTER — Ambulatory Visit (INDEPENDENT_AMBULATORY_CARE_PROVIDER_SITE_OTHER): Payer: Medicaid Other | Admitting: Women's Health

## 2020-05-15 ENCOUNTER — Other Ambulatory Visit: Payer: Medicaid Other

## 2020-05-15 ENCOUNTER — Other Ambulatory Visit: Payer: Self-pay

## 2020-05-15 VITALS — BP 127/71 | HR 96 | Wt 176.8 lb

## 2020-05-15 DIAGNOSIS — Z3492 Encounter for supervision of normal pregnancy, unspecified, second trimester: Secondary | ICD-10-CM

## 2020-05-15 DIAGNOSIS — Z3A27 27 weeks gestation of pregnancy: Secondary | ICD-10-CM

## 2020-05-15 DIAGNOSIS — Z3490 Encounter for supervision of normal pregnancy, unspecified, unspecified trimester: Secondary | ICD-10-CM

## 2020-05-15 DIAGNOSIS — Z23 Encounter for immunization: Secondary | ICD-10-CM

## 2020-05-15 DIAGNOSIS — O99012 Anemia complicating pregnancy, second trimester: Secondary | ICD-10-CM

## 2020-05-15 DIAGNOSIS — D649 Anemia, unspecified: Secondary | ICD-10-CM

## 2020-05-15 NOTE — Patient Instructions (Addendum)
Maternity Assessment Unit (MAU)  The Maternity Assessment Unit (MAU) is located at the [facility] [place] at [facility]. The address is: [address] [place], [place], [place] [address]. Please see map below for additional directions.    The Maternity Assessment Unit is designed to help you during your pregnancy, and for up to 6 weeks after delivery, with any pregnancy- or postpartum-related emergencies, if you think you are in labor, or if your water has broken. For example, if you experience nausea and vomiting, vaginal bleeding, severe abdominal or pelvic pain, elevated blood pressure or other problems related to your pregnancy or postpartum time, please come to the Maternity Assessment Unit for assistance.      Glucose Tolerance Test During Pregnancy Why am I having this test? The glucose tolerance test (GTT) is done to check how your body processes sugar (glucose). This is one of several tests used to diagnose diabetes that develops during pregnancy (gestational diabetes mellitus). Gestational diabetes is a temporary form of diabetes that some women develop during pregnancy. It usually occurs during the second trimester of pregnancy and goes away after delivery. Testing (screening) for gestational diabetes usually occurs between 24 and 28 weeks of pregnancy. You may have the GTT test after having a 1-hour glucose screening test if the results from that test indicate that you may have gestational diabetes. You may also have this test if:  You have a history of gestational diabetes.  You have a history of giving birth to very large babies or have experienced repeated fetal loss (stillbirth).  You have signs and symptoms of diabetes, such as: ? Changes in your vision. ? Tingling or numbness in your hands or feet. ? Changes in hunger, thirst, and urination that are not otherwise explained by your pregnancy. What is being tested? This test measures the amount  of glucose in your blood at different times during a period of 3 hours. This indicates how well your body is able to process glucose. What kind of sample is taken?  Blood samples are required for this test. They are usually collected by inserting a needle into a blood vessel. How do I prepare for this test?  For 3 days before your test, eat normally. Have plenty of carbohydrate-rich foods.  Follow instructions from your health care provider about: ? Eating or drinking restrictions on the day of the test. You may be asked to not eat or drink anything other than water (fast) starting 8-10 hours before the test. ? Changing or stopping your regular medicines. Some medicines may interfere with this test. Tell a health care provider about:  All medicines you are taking, including vitamins, herbs, eye drops, creams, and over-the-counter medicines.  Any blood disorders you have.  Any surgeries you have had.  Any medical conditions you have. What happens during the test? First, your blood glucose will be measured. This is referred to as your fasting blood glucose, since you fasted before the test. Then, you will drink a glucose solution that contains a certain amount of glucose. Your blood glucose will be measured again 1, 2, and 3 hours after drinking the solution. This test takes about 3 hours to complete. You will need to stay at the testing location during this time. During the testing period:  Do not eat or drink anything other than the glucose solution.  Do not exercise.  Do not use any products that contain nicotine or tobacco, such as cigarettes and e-cigarettes. If you need  help stopping, ask your health care provider. The testing procedure may vary among health care providers and hospitals. How are the results reported? Your results will be reported as milligrams of glucose per deciliter of blood (mg/dL) or millimoles per liter (mmol/L). Your health care provider will compare your  results to normal ranges that were established after testing a large group of people (reference ranges). Reference ranges may vary among labs and hospitals. For this test, common reference ranges are:  Fasting: less than 95-105 mg/dL (7.3-2.2 mmol/L).  1 hour after drinking glucose: less than 180-190 mg/dL (02.5-42.7 mmol/L).  2 hours after drinking glucose: less than 155-165 mg/dL (0.6-2.3 mmol/L).  3 hours after drinking glucose: 140-145 mg/dL (7.6-2.8 mmol/L). What do the results mean? Results within reference ranges are considered normal, meaning that your glucose levels are well-controlled. If two or more of your blood glucose levels are high, you may be diagnosed with gestational diabetes. If only one level is high, your health care provider may suggest repeat testing or other tests to confirm a diagnosis. Talk with your health care provider about what your results mean. Questions to ask your health care provider Ask your health care provider, or the department that is doing the test:  When will my results be ready?  How will I get my results?  What are my treatment options?  What other tests do I need?  What are my next steps? Summary  The glucose tolerance test (GTT) is one of several tests used to diagnose diabetes that develops during pregnancy (gestational diabetes mellitus). Gestational diabetes is a temporary form of diabetes that some women develop during pregnancy.  You may have the GTT test after having a 1-hour glucose screening test if the results from that test indicate that you may have gestational diabetes. You may also have this test if you have any symptoms or risk factors for gestational diabetes.  Talk with your health care provider about what your results mean. This information is not intended to replace advice given to you by your health care provider. Make sure you discuss any questions you have with your health care provider. Document Revised: 04/07/2019  Document Reviewed: 07/27/2017 Elsevier Patient Education  2020 [employer].        Contraception Choices - [email or web address] Contraception, also called birth control, refers to methods or devices that prevent pregnancy. Hormonal methods Contraceptive implant  A contraceptive implant is a thin, plastic tube that contains a hormone. It is inserted into the upper part of the arm. It can remain in place for up to 3 years. Progestin-only injections Progestin-only injections are injections of progestin, a synthetic form of the hormone progesterone. They are given every 3 months by a health care provider. Birth control pills  Birth control pills are pills that contain hormones that prevent pregnancy. They must be taken once a day, preferably at the same time each day. Birth control patch  The birth control patch contains hormones that prevent pregnancy. It is placed on the skin and must be changed once a week for three weeks and removed on the fourth week. A prescription is needed to use this method of contraception. Vaginal ring  A vaginal ring contains hormones that prevent pregnancy. It is placed in the vagina for three weeks and removed on the fourth week. After that, the process is repeated with a new ring. A prescription is needed to use this method of contraception. Emergency contraceptive Emergency contraceptives prevent pregnancy after unprotected sex. They come in  pill form and can be taken up to 5 days after sex. They work best the sooner they are taken after having sex. Most emergency contraceptives are available without a prescription. This method should not be used as your only form of birth control. Barrier methods Female condom  A female condom is a thin sheath that is worn over the penis during sex. Condoms keep sperm from going inside a woman's body. They can be used with a spermicide to increase their effectiveness. They should be disposed after a single use. Female  condom  A female condom is a soft, loose-fitting sheath that is put into the vagina before sex. The condom keeps sperm from going inside a woman's body. They should be disposed after a single use. Diaphragm  A diaphragm is a soft, dome-shaped barrier. It is inserted into the vagina before sex, along with a spermicide. The diaphragm blocks sperm from entering the uterus, and the spermicide kills sperm. A diaphragm should be left in the vagina for 6-8 hours after sex and removed within 24 hours. A diaphragm is prescribed and fitted by a health care provider. A diaphragm should be replaced every 1-2 years, after giving birth, after gaining more than 15 lb (6.8 kg), and after pelvic surgery. Cervical cap  A cervical cap is a round, soft latex or plastic cup that fits over the cervix. It is inserted into the vagina before sex, along with spermicide. It blocks sperm from entering the uterus. The cap should be left in place for 6-8 hours after sex and removed within 48 hours. A cervical cap must be prescribed and fitted by a health care provider. It should be replaced every 2 years. Sponge  A sponge is a soft, circular piece of polyurethane foam with spermicide on it. The sponge helps block sperm from entering the uterus, and the spermicide kills sperm. To use it, you make it wet and then insert it into the vagina. It should be inserted before sex, left in for at least 6 hours after sex, and removed and thrown away within 30 hours. Spermicides Spermicides are chemicals that kill or block sperm from entering the cervix and uterus. They can come as a cream, jelly, suppository, foam, or tablet. A spermicide should be inserted into the vagina with an applicator at least 10-15 minutes before sex to allow time for it to work. The process must be repeated every time you have sex. Spermicides do not require a prescription. Intrauterine contraception Intrauterine device (IUD) An IUD is a T-shaped device that is put  in a woman's uterus. There are two types:  Hormone IUD.This type contains progestin, a synthetic form of the hormone progesterone. This type can stay in place for 3-5 years.  Copper IUD.This type is wrapped in copper wire. It can stay in place for 10 years.  Permanent methods of contraception Female tubal ligation In this method, a woman's fallopian tubes are sealed, tied, or blocked during surgery to prevent eggs from traveling to the uterus. Hysteroscopic sterilization In this method, a small, flexible insert is placed into each fallopian tube. The inserts cause scar tissue to form in the fallopian tubes and block them, so sperm cannot reach an egg. The procedure takes about 3 months to be effective. Another form of birth control must be used during those 3 months. Female sterilization This is a procedure to tie off the tubes that carry sperm (vasectomy). After the procedure, the man can still ejaculate fluid (semen). Natural planning methods Natural  family planning In this method, a couple does not have sex on days when the woman could become pregnant. Calendar method This means keeping track of the length of each menstrual cycle, identifying the days when pregnancy can happen, and not having sex on those days. Ovulation method In this method, a couple avoids sex during ovulation. Symptothermal method This method involves not having sex during ovulation. The woman typically checks for ovulation by watching changes in her temperature and in the consistency of cervical mucus. Post-ovulation method In this method, a couple waits to have sex until after ovulation. Summary  Contraception, also called birth control, means methods or devices that prevent pregnancy.  Hormonal methods of contraception include implants, injections, pills, patches, vaginal rings, and emergency contraceptives.  Barrier methods of contraception can include female condoms, female condoms, diaphragms, cervical caps,  sponges, and spermicides.  There are two types of IUDs (intrauterine devices). An IUD can be put in a woman's uterus to prevent pregnancy for 3-5 years.  Permanent sterilization can be done through a procedure for males, females, or both.  Natural family planning methods involve not having sex on days when the woman could become pregnant. This information is not intended to replace advice given to you by your health care provider. Make sure you discuss any questions you have with your health care provider. Document Revised: 12/17/2017 Document Reviewed: 01/17/2017 Elsevier Patient Education  2020 [employer].       https://www.cdc.gov/vaccines/hcp/vis/vis-statements/tdap.pdf">  Tdap (Tetanus, Diphtheria, Pertussis) Vaccine: What You Need to Know 1. Why get vaccinated? Tdap vaccine can prevent tetanus, diphtheria, and pertussis. Diphtheria and pertussis spread from person to person. Tetanus enters the body through cuts or wounds.  TETANUS (T) causes painful stiffening of the muscles. Tetanus can lead to serious health problems, including being unable to open the mouth, having trouble swallowing and breathing, or death.  DIPHTHERIA (D) can lead to difficulty breathing, heart failure, paralysis, or death.  PERTUSSIS (aP), also known as "whooping cough," can cause uncontrollable, violent coughing which makes it hard to breathe, eat, or drink. Pertussis can be extremely serious in babies and young children, causing pneumonia, convulsions, brain damage, or death. In teens and adults, it can cause weight loss, loss of bladder control, passing out, and rib fractures from severe coughing. 2. Tdap vaccine Tdap is only for children 7 years and older, adolescents, and adults.  Adolescents should receive a single dose of Tdap, preferably at age 60 or 12 years. Pregnant women should get a dose of Tdap during every pregnancy, to protect the newborn from pertussis. Infants are most at risk for  severe, life-threatening complications from pertussis. Adults who have never received Tdap should get a dose of Tdap. Also, adults should receive a booster dose every 10 years, or earlier in the case of a severe and dirty wound or burn. Booster doses can be either Tdap or Td (a different vaccine that protects against tetanus and diphtheria but not pertussis). Tdap may be given at the same time as other vaccines. 3. Talk with your health care provider Tell your vaccine provider if the person getting the vaccine:  Has had an allergic reaction after a previous dose of any vaccine that protects against tetanus, diphtheria, or pertussis, or has any severe, life-threatening allergies.  Has had a coma, decreased level of consciousness, or prolonged seizures within 7 days after a previous dose of any pertussis vaccine (DTP, DTaP, or Tdap).  Has seizures or another nervous system problem.  Has ever  had Guillain-Barr Syndrome (also called GBS).  Has had severe pain or swelling after a previous dose of any vaccine that protects against tetanus or diphtheria. In some cases, your health care provider may decide to postpone Tdap vaccination to a future visit.  People with minor illnesses, such as a cold, may be vaccinated. People who are moderately or severely ill should usually wait until they recover before getting Tdap vaccine.  Your health care provider can give you more information. 4. Risks of a vaccine reaction  Pain, redness, or swelling where the shot was given, mild fever, headache, feeling tired, and nausea, vomiting, diarrhea, or stomachache sometimes happen after Tdap vaccine. People sometimes faint after medical procedures, including vaccination. Tell your provider if you feel dizzy or have vision changes or ringing in the ears.  As with any medicine, there is a very remote chance of a vaccine causing a severe allergic reaction, other serious injury, or death. 5. What if there is a serious  problem? An allergic reaction could occur after the vaccinated person leaves the clinic. If you see signs of a severe allergic reaction (hives, swelling of the face and throat, difficulty breathing, a fast heartbeat, dizziness, or weakness), call 9-1-1 and get the person to the nearest hospital. For other signs that concern you, call your health care provider.  Adverse reactions should be reported to the Vaccine Adverse Event Reporting System (VAERS). Your health care provider will usually file this report, or you can do it yourself. Visit the VAERS website at www.vaers.LAgents.nohhs.gov or call 680 048 32681-(854)138-1140. VAERS is only for reporting reactions, and VAERS staff do not give medical advice. 6. The National Vaccine Injury Compensation Program The [employer] Vaccine Injury Compensation Program (VICP) is a federal program that was created to compensate people who may have been injured by certain vaccines. Visit the VICP website at SpiritualWord.atwww.hrsa.gov/vaccinecompensation or call 873 417 66861-7372046330 to learn about the program and about filing a claim. There is a time limit to file a claim for compensation. 7. How can I learn more?  Ask your health care provider.  Call your local or state health department.  Contact the Centers for Disease Control and Prevention (CDC): ? Call 325-737-96721-(734)874-3019 (1-800-CDC-INFO) or ? Visit CDC's website at PicCapture.uywww.cdc.gov/vaccines Vaccine Information Statement Tdap (Tetanus, Diphtheria, Pertussis) Vaccine (03/30/2019) This information is not intended to replace advice given to you by your health care provider. Make sure you discuss any questions you have with your health care provider. Document Revised: 04/08/2019 Document Reviewed: 04/11/2019 Elsevier Patient Education  2020 Elsevier Inc.        Pregnancy and Anemia  Anemia is a condition in which the concentration of red blood cells, or hemoglobin, in the blood is below normal. Hemoglobin is a substance in red blood cells that carries  oxygen to the tissues of the body. Anemia results when enough oxygen does not reach these tissues. Anemia is common during pregnancy because the woman's body needs more blood volume and blood cells to provide nutrition to the fetus. The fetus needs iron and folic acid as it is developing. Your body may not produce enough red blood cells because of this. Also, during pregnancy, the liquid part of the blood (plasma) increases by about 30-50%, and the red blood cells increase by only 20%. This lowers the concentration of the red blood cells and creates a natural anemia-like situation. What are the causes? The most common cause of anemia during pregnancy is not having enough iron in the body to make red blood  cells (iron deficiency anemia). Other causes may include:  Folic acid deficiency.  Vitamin B12 deficiency.  Certain prescription or over-the-counter medicines.  Certain medical conditions or infections that destroy red blood cells.  A low platelet count and bleeding caused by antibodies that go through the placenta to the fetus from the mother's blood. What are the signs or symptoms? Mild anemia may not be noticeable. If it becomes severe, symptoms may include:  Feeling tired (fatigue).  Shortness of breath, especially during activity.  Weakness.  Fainting.  Pale looking skin.  Headaches.  A fast or irregular heartbeat (palpitations).  Dizziness. How is this diagnosed? This condition may be diagnosed based on:  Your medical history and a physical exam.  Blood tests. How is this treated? Treatment for anemia during pregnancy depends on the cause of the anemia. Treatment can include:  Dietary changes.  Supplements of iron, vitamin B12, or folic acid.  A blood transfusion. This may be needed if anemia is severe.  Hospitalization. This may be needed if there is a lot of blood loss or severe anemia. Follow these instructions at home:  Follow recommendations from your  dietitian or health care provider about changing your diet.  Increase your vitamin C intake. This will help the stomach absorb more iron. Some foods that are high in vitamin C include: ? Oranges. ? Peppers. ? Tomatoes. ? Mangoes.  Eat a diet rich in iron. This would include foods such as: ? Liver. ? Beef. ? Eggs. ? Whole grains. ? Spinach. ? Dried fruit.  Take iron and vitamins as told by your health care provider.  Eat green leafy vegetables. These are a good source of folic acid.  Keep all follow-up visits as told by your health care provider. This is important. Contact a health care provider if:  You have frequent or lasting headaches.  You look pale.  You bruise easily. Get help right away if:  You have extreme weakness, shortness of breath, or chest pain.  You become dizzy or have trouble concentrating.  You have heavy vaginal bleeding.  You develop a rash.  You have bloody or black, tarry stools.  You faint.  You vomit up blood.  You vomit repeatedly.  You have abdominal pain.  You have a fever.  You are dehydrated. Summary  Anemia is a condition in which the concentration of red blood cells or hemoglobin in the blood is below normal.  Anemia is common during pregnancy because the woman's body needs more blood volume and blood cells to provide nutrition to the fetus.  The most common cause of anemia during pregnancy is not having enough iron in the body to make red blood cells (iron deficiency anemia).  Mild anemia may not be noticeable. If it becomes severe, symptoms may include feeling tired and weak. This information is not intended to replace advice given to you by your health care provider. Make sure you discuss any questions you have with your health care provider. Document Revised: 05/31/2019 Document Reviewed: 01/20/2017 Elsevier Patient Education  2020 [employer].       Preterm Labor and Birth Information  The normal length of a  pregnancy is 39-41 weeks. Preterm labor is when labor starts before 37 completed weeks of pregnancy. What are the risk factors for preterm labor? Preterm labor is more likely to occur in women who:  Have certain infections during pregnancy such as a bladder infection, sexually transmitted infection, or infection inside the uterus (chorioamnionitis).  Have a shorter-than-normal  cervix.  Have gone into preterm labor before.  Have had surgery on their cervix.  Are younger than age 77 or older than age 26.  Are African American.  Are pregnant with twins or multiple babies (multiple gestation).  Take street drugs or smoke while pregnant.  Do not gain enough weight while pregnant.  Became pregnant shortly after having been pregnant. What are the symptoms of preterm labor? Symptoms of preterm labor include:  Cramps similar to those that can happen during a menstrual period. The cramps may happen with diarrhea.  Pain in the abdomen or lower back.  Regular uterine contractions that may feel like tightening of the abdomen.  A feeling of increased pressure in the pelvis.  Increased watery or bloody mucus discharge from the vagina.  Water breaking (ruptured amniotic sac). Why is it important to recognize signs of preterm labor? It is important to recognize signs of preterm labor because babies who are born prematurely may not be fully developed. This can put them at an increased risk for:  Long-term (chronic) heart and lung problems.  Difficulty immediately after birth with regulating body systems, including blood sugar, body temperature, heart rate, and breathing rate.  Bleeding in the brain.  Cerebral palsy.  Learning difficulties.  Death. These risks are highest for babies who are born before 5 weeks of pregnancy. How is preterm labor treated? Treatment depends on the length of your pregnancy, your condition, and the health of your baby. It may involve:  Having a stitch  (suture) placed in your cervix to prevent your cervix from opening too early (cerclage).  Taking or being given medicines, such as: ? Hormone medicines. These may be given early in pregnancy to help support the pregnancy. ? Medicine to stop contractions. ? Medicines to help mature the baby's lungs. These may be prescribed if the risk of delivery is high. ? Medicines to prevent your baby from developing cerebral palsy. If the labor happens before 34 weeks of pregnancy, you may need to stay in the hospital. What should I do if I think I am in preterm labor? If you think that you are going into preterm labor, call your health care provider right away. How can I prevent preterm labor in future pregnancies? To increase your chance of having a full-term pregnancy:  Do not use any tobacco products, such as cigarettes, chewing tobacco, and e-cigarettes. If you need help quitting, ask your health care provider.  Do not use street drugs or medicines that have not been prescribed to you during your pregnancy.  Talk with your health care provider before taking any herbal supplements, even if you have been taking them regularly.  Make sure you gain a healthy amount of weight during your pregnancy.  Watch for infection. If you think that you might have an infection, get it checked right away.  Make sure to tell your health care provider if you have gone into preterm labor before. This information is not intended to replace advice given to you by your health care provider. Make sure you discuss any questions you have with your health care provider. Document Revised: 04/08/2019 Document Reviewed: 05/07/2016 Elsevier Patient Education  [place].

## 2020-05-15 NOTE — Progress Notes (Signed)
Subjective:  Joy White is a 24 y.o. G4P0030 at 102w5d being seen today for ongoing prenatal care.  She is currently monitored for the following issues for this low-risk pregnancy and has History of recurrent UTIs; Supervision of low-risk pregnancy; History of pyelonephritis; Slow transit constipation; and Anemia in pregnancy on their problem list.  Patient reports no complaints.  Contractions: Irritability. Vag. Bleeding: None.  Movement: Present. Denies leaking of fluid.   The following portions of the patient's history were reviewed and updated as appropriate: allergies, current medications, past family history, past medical history, past social history, past surgical history and problem list. Problem list updated.  Objective:   Vitals:   05/15/20 0824  BP: 127/71  Pulse: 96  Weight: 176 lb 12.8 oz (80.2 kg)    Fetal Status: Fetal Heart Rate (bpm): 163 Fundal Height: 28 cm Movement: Present     General:  Alert, oriented and cooperative. Patient is in no acute distress.  Skin: Skin is warm and dry. No rash noted.   Cardiovascular: Normal heart rate noted  Respiratory: Normal respiratory effort, no problems with respiration noted  Abdomen: Soft, gravid, appropriate for gestational age. Pain/Pressure: Present     Pelvic: Vag. Bleeding: None     Cervical exam deferred        Extremities: Normal range of motion.  Edema: Trace  Mental Status: Normal mood and affect. Normal behavior. Normal judgment and thought content.   Urinalysis:      Assessment and Plan:  Pregnancy: G4P0030 at [redacted]w[redacted]d  1. Encounter for supervision of low-risk pregnancy in second trimester -Tdap today -information given on contraception, patient considering non-hormonal IUD -patient recently moved to [place] and is considering transferring practices  2. Anemia during pregnancy in second trimester -9.5 at 15 weeks -CBC today  Preterm labor symptoms and general obstetric precautions including but not  limited to vaginal bleeding, contractions, leaking of fluid and fetal movement were reviewed in detail with the patient. Please refer to After Visit Summary for other counseling recommendations.  Return in about 2 weeks (around 05/29/2020) for in-person LOB/APP OK.   Shonn Farruggia, Odie Sera, NP

## 2020-05-15 NOTE — Addendum Note (Signed)
Addended by: Faythe Casa on: 05/15/2020 12:36 PM   Modules accepted: Orders

## 2020-05-16 LAB — CBC
Hematocrit: 34.7 % (ref 34.0–46.6)
Hemoglobin: 11 g/dL — ABNORMAL LOW (ref 11.1–15.9)
MCH: 24.2 pg — ABNORMAL LOW (ref 26.6–33.0)
MCHC: 31.7 g/dL (ref 31.5–35.7)
MCV: 76 fL — ABNORMAL LOW (ref 79–97)
Platelets: 284 10*3/uL (ref 150–450)
RBC: 4.54 x10E6/uL (ref 3.77–5.28)
RDW: 17.4 % — ABNORMAL HIGH (ref 11.7–15.4)
WBC: 10.7 10*3/uL (ref 3.4–10.8)

## 2020-05-16 LAB — HIV ANTIBODY (ROUTINE TESTING W REFLEX): HIV Screen 4th Generation wRfx: NONREACTIVE

## 2020-05-16 LAB — RPR: RPR Ser Ql: NONREACTIVE

## 2020-05-16 LAB — GLUCOSE TOLERANCE, 2 HOURS W/ 1HR
Glucose, 1 hour: 127 mg/dL (ref 65–179)
Glucose, 2 hour: 99 mg/dL (ref 65–152)
Glucose, Fasting: 81 mg/dL (ref 65–91)

## 2020-06-04 ENCOUNTER — Encounter: Payer: Medicaid Other | Admitting: Family Medicine

## 2020-06-06 ENCOUNTER — Encounter: Payer: Self-pay | Admitting: Certified Nurse Midwife

## 2020-06-06 ENCOUNTER — Ambulatory Visit (INDEPENDENT_AMBULATORY_CARE_PROVIDER_SITE_OTHER): Payer: Medicaid Other | Admitting: Certified Nurse Midwife

## 2020-06-06 ENCOUNTER — Other Ambulatory Visit: Payer: Self-pay

## 2020-06-06 VITALS — BP 123/69 | HR 99 | Wt 185.1 lb

## 2020-06-06 DIAGNOSIS — Z3493 Encounter for supervision of normal pregnancy, unspecified, third trimester: Secondary | ICD-10-CM

## 2020-06-06 DIAGNOSIS — Z87448 Personal history of other diseases of urinary system: Secondary | ICD-10-CM

## 2020-06-06 DIAGNOSIS — Z3A30 30 weeks gestation of pregnancy: Secondary | ICD-10-CM

## 2020-06-06 DIAGNOSIS — D649 Anemia, unspecified: Secondary | ICD-10-CM

## 2020-06-06 DIAGNOSIS — O99019 Anemia complicating pregnancy, unspecified trimester: Secondary | ICD-10-CM

## 2020-06-06 DIAGNOSIS — Z3492 Encounter for supervision of normal pregnancy, unspecified, second trimester: Secondary | ICD-10-CM

## 2020-06-06 NOTE — Progress Notes (Signed)
   PRENATAL VISIT NOTE  Subjective:  Joy White is a 24 y.o. G4P0030 at [redacted]w[redacted]d being seen today for ongoing prenatal care.  She is currently monitored for the following issues for this low-risk pregnancy and has History of recurrent UTIs; Supervision of low-risk pregnancy; History of pyelonephritis; Slow transit constipation; and Anemia in pregnancy on their problem list.  Patient reports edema and irritability.  Contractions: Irritability. Vag. Bleeding: None.  Movement: Present. Denies leaking of fluid.   The following portions of the patient's history were reviewed and updated as appropriate: allergies, current medications, past family history, past medical history, past social history, past surgical history and problem list.   Objective:   Vitals:   06/06/20 0909  BP: 123/69  Pulse: 99  Weight: 185 lb 1.6 oz (84 kg)    Fetal Status: Fetal Heart Rate (bpm): 139 Fundal Height: 33 cm Movement: Present     General:  Alert, oriented and cooperative. Patient is in no acute distress.  Skin: Skin is warm and dry. No rash noted.   Cardiovascular: Normal heart rate noted  Respiratory: Normal respiratory effort, no problems with respiration noted  Abdomen: Soft, gravid, appropriate for gestational age.  Pain/Pressure: Present     Pelvic: Cervical exam deferred        Extremities: Normal range of motion.  Edema: Trace  Mental Status: Normal mood and affect. Normal behavior. Normal judgment and thought content.   Assessment and Plan:  Pregnancy: G4P0030 at [redacted]w[redacted]d 1. Encounter for supervision of low-risk pregnancy in third trimester - Patient doing well - Patient reports LE edema and irritability over the past week, patient reports that she was walking a lot while out of town. Discussed water intake with patient - reports that she was only consuming 2 bottles of water during day. Encouraged patient to increase amount of water she consumes to 6-7 bottlers per day, take frequent breaks with  walking, elevated legs when not walking. This will help edema and irritability as well, patient verbalizes understanding  - Routine prenatal care - Anticipatory guidance on upcoming appointments with the next being virtual appointment  - Educated and discussed GBS testing during pregnancy around 36 weeks, discussed recommendations of antibiotics during labor if positive  - patient plans on having birth plan, encouraged to start writing birth plan and bringing it to appointments to have discussions.   2. Antepartum anemia  Preterm labor symptoms and general obstetric precautions including but not limited to vaginal bleeding, contractions, leaking of fluid and fetal movement were reviewed in detail with the patient. Please refer to After Visit Summary for other counseling recommendations.   Return in about 2 weeks (around 06/20/2020) for ROB- mychart .  Future Appointments  Date Time Provider Department Center  06/21/2020  3:15 PM Rasch, Harolyn Rutherford, NP [facility]    Sharyon Cable, CNM

## 2020-06-06 NOTE — Patient Instructions (Addendum)
Third Trimester of Pregnancy  The third trimester is from week 28 through week 40 (months 7 through 9). This trimester is when your unborn baby (fetus) is growing very fast. At the end of the ninth month, the unborn baby is about 20 inches in length. It weighs about 6-10 pounds. Follow these instructions at home: Medicines  Take over-the-counter and prescription medicines only as told by your doctor. Some medicines are safe and some medicines are not safe during pregnancy.  Take a prenatal vitamin that contains at least 600 micrograms (mcg) of folic acid.  If you have trouble pooping (constipation), take medicine that will make your stool soft (stool softener) if your doctor approves. Eating and drinking   Eat regular, healthy meals.  Avoid raw meat and uncooked cheese.  If you get low calcium from the food you eat, talk to your doctor about taking a daily calcium supplement.  Eat four or five small meals rather than three large meals a day.  Avoid foods that are high in fat and sugars, such as fried and sweet foods.  To prevent constipation: ? Eat foods that are high in fiber, like fresh fruits and vegetables, whole grains, and beans. ? Drink enough fluids to keep your pee (urine) clear or pale yellow. Activity  Exercise only as told by your doctor. Stop exercising if you start to have cramps.  Avoid heavy lifting, wear low heels, and sit up straight.  Do not exercise if it is too hot, too humid, or if you are in a place of great height (high altitude).  You may continue to have sex unless your doctor tells you not to. Relieving pain and discomfort  Wear a good support bra if your breasts are tender.  Take frequent breaks and rest with your legs raised if you have leg cramps or low back pain.  Take warm water baths (sitz baths) to soothe pain or discomfort caused by hemorrhoids. Use hemorrhoid cream if your doctor approves.  If you develop puffy, bulging veins (varicose  veins) in your legs: ? Wear support hose or compression stockings as told by your doctor. ? Raise (elevate) your feet for 15 minutes, 3-4 times a day. ? Limit salt in your food. Safety  Wear your seat belt when driving.  Make a list of emergency phone numbers, including numbers for family, friends, the hospital, and police and fire departments. Preparing for your baby's arrival To prepare for the arrival of your baby:  Take prenatal classes.  Practice driving to the hospital.  Visit the hospital and tour the maternity area.  Talk to your work about taking leave once the baby comes.  Pack your hospital bag.  Prepare the baby's room.  Go to your doctor visits.  Buy a rear-facing car seat. Learn how to install it in your car. General instructions  Do not use hot tubs, steam rooms, or saunas.  Do not use any products that contain nicotine or tobacco, such as cigarettes and e-cigarettes. If you need help quitting, ask your doctor.  Do not drink alcohol.  Do not douche or use tampons or scented sanitary pads.  Do not cross your legs for long periods of time.  Do not travel for long distances unless you must. Only do so if your doctor says it is okay.  Visit your dentist if you have not gone during your pregnancy. Use a soft toothbrush to brush your teeth. Be gentle when you floss.  Avoid cat litter boxes and soil   used by cats. These carry germs that can cause birth defects in the baby and can cause a loss of your baby (miscarriage) or stillbirth.  Keep all your prenatal visits as told by your doctor. This is important. Contact a doctor if:  You are not sure if you are in labor or if your water has broken.  You are dizzy.  You have mild cramps or pressure in your lower belly.  You have a nagging pain in your belly area.  You continue to feel sick to your stomach, you throw up, or you have watery poop.  You have bad smelling fluid coming from your vagina.  You have  pain when you pee. Get help right away if:  You have a fever.  You are leaking fluid from your vagina.  You are spotting or bleeding from your vagina.  You have severe belly cramps or pain.  You lose or gain weight quickly.  You have trouble catching your breath and have chest pain.  You notice sudden or extreme puffiness (swelling) of your face, hands, ankles, feet, or legs.  You have not felt the baby move in over an hour.  You have severe headaches that do not go away with medicine.  You have trouble seeing.  You are leaking, or you are having a gush of fluid, from your vagina before you are 37 weeks.  You have regular belly spasms (contractions) before you are 37 weeks. Summary  The third trimester is from week 28 through week 40 (months 7 through 9). This time is when your unborn baby is growing very fast.  Follow your doctor's advice about medicine, food, and activity.  Get ready for the arrival of your baby by taking prenatal classes, getting all the baby items ready, preparing the baby's room, and visiting your doctor to be checked.  Get help right away if you are bleeding from your vagina, or you have chest pain and trouble catching your breath, or if you have not felt your baby move in over an hour. This information is not intended to replace advice given to you by your health care provider. Make sure you discuss any questions you have with your health care provider. Document Revised: 04/07/2019 Document Reviewed: 01/20/2017 Elsevier Patient Education  2020 Elsevier Inc.   Group B Streptococcus Test During Pregnancy Why am I having this test? Routine testing, also called screening, for group B streptococcus (GBS) is recommended for all pregnant women between the 36th and 37th week of pregnancy. GBS is a type of bacteria that can be passed from mother to baby during childbirth. Screening will help guide whether or not you will need treatment during labor and  delivery to prevent complications such as:  An infection in your uterus during labor.  An infection in your uterus after delivery.  A serious infection in your baby after delivery, such as pneumonia, meningitis, or sepsis. GBS screening is not often done before 36 weeks of pregnancy unless you go into labor prematurely. What happens if I have group B streptococcus? If testing shows that you have GBS, your health care provider will recommend treatment with IV antibiotics during labor and delivery. This treatment significantly decreases the risk of complications for you and your baby. If you have a planned C-section and you have GBS, you may not need to be treated with antibiotics because GBS is usually passed to babies after labor starts and your water breaks. If you are in labor or your water breaks   before your C-section, it is possible for GBS to get into your uterus and be passed to your baby, so you might need treatment. Is there a chance I may not need to be tested? You may not need to be tested for GBS if:  You have a urine test that shows GBS before 36 to 37 weeks.  You had a baby with GBS infection after a previous delivery. In these cases, you will automatically be treated for GBS during labor and delivery. What is being tested? This test is done to check if you have group B streptococcus in your vagina or rectum. What kind of sample is taken? To collect samples for this test, your health care provider will swab your vagina and rectum with a cotton swab. The sample is then sent to the lab to see if GBS is present. What happens during the test?   You will remove your clothing from the waist down.  You will lie down on an exam table in the same position as you would for a pelvic exam.  Your health care provider will swab your vagina and rectum to collect samples for a culture test.  You will be able to go home after the test and do all your usual activities. How are the results  reported? The test results are reported as positive or negative. What do the results mean?  A positive test means you are at risk for passing GBS to your baby during labor and delivery. Your health care provider will recommend that you are treated with an IV antibiotic during labor and delivery.  A negative test means you are at very low risk of passing GBS to your baby. There is still a low risk of passing GBS to your baby because sometimes test results may report that you do not have a condition when you do (false-negative result) or there is a chance that you may become infected with GBS after the test is done. You most likely will not need to be treated with an antibiotic during labor and delivery. Talk with your health care provider about what your results mean. Questions to ask your health care provider Ask your health care provider, or the department that is doing the test:  When will my results be ready?  How will I get my results?  What are my treatment options? Summary  Routine testing (screening) for group B streptococcus (GBS) is recommended for all pregnant women between the 36th and 37th week of pregnancy.  GBS is a type of bacteria that can be passed from mother to baby during childbirth.  If testing shows that you have GBS, your health care provider will recommend that you are treated with IV antibiotics during labor and delivery. This treatment almost always prevents infection in newborns. This information is not intended to replace advice given to you by your health care provider. Make sure you discuss any questions you have with your health care provider. Document Revised: 04/07/2019 Document Reviewed: 01/12/2019 Elsevier Patient Education  2020 Elsevier Inc.  

## 2020-06-21 ENCOUNTER — Telehealth (INDEPENDENT_AMBULATORY_CARE_PROVIDER_SITE_OTHER): Payer: Medicaid Other | Admitting: Obstetrics and Gynecology

## 2020-06-21 ENCOUNTER — Encounter: Payer: Self-pay | Admitting: Obstetrics and Gynecology

## 2020-06-21 ENCOUNTER — Other Ambulatory Visit: Payer: Self-pay

## 2020-06-21 DIAGNOSIS — D649 Anemia, unspecified: Secondary | ICD-10-CM

## 2020-06-21 DIAGNOSIS — Z3A33 33 weeks gestation of pregnancy: Secondary | ICD-10-CM

## 2020-06-21 DIAGNOSIS — Z3493 Encounter for supervision of normal pregnancy, unspecified, third trimester: Secondary | ICD-10-CM

## 2020-06-21 DIAGNOSIS — Z87448 Personal history of other diseases of urinary system: Secondary | ICD-10-CM | POA: Diagnosis not present

## 2020-06-21 DIAGNOSIS — Z87440 Personal history of urinary (tract) infections: Secondary | ICD-10-CM | POA: Diagnosis not present

## 2020-06-21 DIAGNOSIS — O99213 Obesity complicating pregnancy, third trimester: Secondary | ICD-10-CM | POA: Diagnosis not present

## 2020-06-21 NOTE — Progress Notes (Signed)
   TELEHEALTH OBSTETRICS VISIT ENCOUNTER NOTE  I connected with Joy White on 06/21/20 at  3:15 PM EDT by telephone at home and verified that I am speaking with the correct person using two identifiers.   I discussed the limitations, risks, security and privacy concerns of performing an evaluation and management service by telephone and the availability of in person appointments. I also discussed with the patient that there may be a patient responsible charge related to this service. The patient expressed understanding and agreed to proceed.  Subjective:  Joy White is a 24 y.o. G4P0030 at [redacted]w[redacted]d being followed for ongoing prenatal care.  She is currently monitored for the following issues for this low-risk pregnancy and has History of recurrent UTIs; Supervision of low-risk pregnancy; History of pyelonephritis; Slow transit constipation; and Anemia in pregnancy on their problem list.  Patient reports no complaints. Reports fetal movement. Denies any contractions, bleeding or leaking of fluid.   The following portions of the patient's history were reviewed and updated as appropriate: allergies, current medications, past family history, past medical history, past social history, past surgical history and problem list.   Objective:   General:  Alert, oriented and cooperative.   Mental Status: Normal mood and affect perceived. Normal judgment and thought content.  Rest of physical exam deferred due to type of encounter  Assessment and Plan:  Pregnancy: G4P0030 at [redacted]w[redacted]d 1. Encounter for supervision of low-risk pregnancy in third trimester  Normal 2 hour GTT  BP good 121/78 Hgb 11.0 Moved a couple of months ago to [employer] in water [place], she is registered for the class.  CNM and consent next visit.   Preterm labor symptoms and general obstetric precautions including but not limited to vaginal bleeding, contractions, leaking of fluid and fetal movement were reviewed in  detail with the patient.  I discussed the assessment and treatment plan with the patient. The patient was provided an opportunity to ask questions and all were answered. The patient agreed with the plan and demonstrated an understanding of the instructions. The patient was advised to call back or seek an in-person office evaluation/go to [facility] for any urgent or concerning symptoms. Please refer to After Visit Summary for other counseling recommendations.   I provided 10 minutes of non-face-to-face time during this encounter.  Return in about 2 weeks (around 07/05/2020), or Scheduled after 35 weeks for GBS collection- in person visit.  No future appointments.  Venia Carbon, NP [facility] for [employer], [facility]

## 2020-07-12 ENCOUNTER — Encounter: Payer: Medicaid Other | Admitting: Obstetrics and Gynecology

## 2020-07-19 ENCOUNTER — Other Ambulatory Visit: Payer: Self-pay

## 2020-07-19 ENCOUNTER — Ambulatory Visit (INDEPENDENT_AMBULATORY_CARE_PROVIDER_SITE_OTHER): Payer: Medicaid Other | Admitting: Medical

## 2020-07-19 ENCOUNTER — Other Ambulatory Visit (HOSPITAL_COMMUNITY)
Admission: RE | Admit: 2020-07-19 | Discharge: 2020-07-19 | Disposition: A | Discharge status: Home / Self Care | Payer: Medicaid Other | Source: Ambulatory Visit | Attending: Medical | Admitting: Medical

## 2020-07-19 VITALS — BP 108/67 | HR 105 | Wt 193.0 lb

## 2020-07-19 DIAGNOSIS — O99013 Anemia complicating pregnancy, third trimester: Secondary | ICD-10-CM

## 2020-07-19 DIAGNOSIS — Z3493 Encounter for supervision of normal pregnancy, unspecified, third trimester: Secondary | ICD-10-CM | POA: Diagnosis not present

## 2020-07-19 DIAGNOSIS — D649 Anemia, unspecified: Secondary | ICD-10-CM

## 2020-07-19 DIAGNOSIS — Z3A37 37 weeks gestation of pregnancy: Secondary | ICD-10-CM

## 2020-07-19 NOTE — Progress Notes (Signed)
   PRENATAL VISIT NOTE  Subjective:  Joy White is a 24 y.o. G4P0030 at [redacted]w[redacted]d being seen today for ongoing prenatal care.  She is currently monitored for the following issues for this low-risk pregnancy and has History of recurrent UTIs; Supervision of low-risk pregnancy; History of pyelonephritis; Slow transit constipation; and Anemia in pregnancy on their problem list.  Patient reports no complaints.  Contractions: Irritability. Vag. Bleeding: None.  Movement: Present. Denies leaking of fluid.   The following portions of the patient's history were reviewed and updated as appropriate: allergies, current medications, past family history, past medical history, past social history, past surgical history and problem list.   Objective:   Vitals:   07/19/20 1610  BP: 108/67  Pulse: 105  Weight: 193 lb (87.5 kg)    Fetal Status: Fetal Heart Rate (bpm): 141 Fundal Height: 37 cm Movement: Present     General:  Alert, oriented and cooperative. Patient is in no acute distress.  Skin: Skin is warm and dry. No rash noted.   Cardiovascular: Normal heart rate noted  Respiratory: Normal respiratory effort, no problems with respiration noted  Abdomen: Soft, gravid, appropriate for gestational age.  Pain/Pressure: Present     Pelvic: Cervical exam deferred       Patient declined cervix check today. Reports closed cervix in [facility] LD triage last week.   Extremities: Normal range of motion.  Edema: Trace  Mental Status: Normal mood and affect. Normal behavior. Normal judgment and thought content.   Assessment and Plan:  Pregnancy: G4P0030 at [redacted]w[redacted]d 1. Encounter for supervision of low-risk pregnancy in third trimester - Culture, beta strep (group b only) - GC/Chlamydia probe amp ([place])not at [facility] - Planning to deliver in [place], ROI signed today  - Will schedule virtual follow-up next week in case she cannot get scheduled there on short notice  2. Anemia during pregnancy in third  trimester  Term labor symptoms and general obstetric precautions including but not limited to vaginal bleeding, contractions, leaking of fluid and fetal movement were reviewed in detail with the patient. Please refer to After Visit Summary for other counseling recommendations.   Return in about 1 week (around 07/26/2020) for LOB, Virtual, any provider.  Future Appointments  Date Time Provider Department Center  07/26/2020  1:15 PM Rasch, Harolyn Rutherford, NP [facility]    Vonzella Nipple, PA-C

## 2020-07-19 NOTE — Patient Instructions (Signed)
Fetal Movement Counts Patient Name: ________________________________________________ Patient Due Date: ____________________ What is a fetal movement count?  A fetal movement count is the number of times that you feel your baby move during a certain amount of time. This may also be called a fetal kick count. A fetal movement count is recommended for every pregnant woman. You may be asked to start counting fetal movements as early as week 28 of your pregnancy. Pay attention to when your baby is most active. You may notice your baby's sleep and wake cycles. You may also notice things that make your baby move more. You should do a fetal movement count:  When your baby is normally most active.  At the same time each day. A good time to count movements is while you are resting, after having something to eat and drink. How do I count fetal movements? 1. Find a quiet, comfortable area. Sit, or lie down on your side. 2. Write down the date, the start time and stop time, and the number of movements that you felt between those two times. Take this information with you to your health care visits. 3. Write down your start time when you feel the first movement. 4. Count kicks, flutters, swishes, rolls, and jabs. You should feel at least 10 movements. 5. You may stop counting after you have felt 10 movements, or if you have been counting for 2 hours. Write down the stop time. 6. If you do not feel 10 movements in 2 hours, contact your health care provider for further instructions. Your health care provider may want to do additional tests to assess your baby's well-being. Contact a health care provider if:  You feel fewer than 10 movements in 2 hours.  Your baby is not moving like he or she usually does. Date: ____________ Start time: ____________ Stop time: ____________ Movements: ____________ Date: ____________ Start time: ____________ Stop time: ____________ Movements: ____________ Date: ____________  Start time: ____________ Stop time: ____________ Movements: ____________ Date: ____________ Start time: ____________ Stop time: ____________ Movements: ____________ Date: ____________ Start time: ____________ Stop time: ____________ Movements: ____________ Date: ____________ Start time: ____________ Stop time: ____________ Movements: ____________ Date: ____________ Start time: ____________ Stop time: ____________ Movements: ____________ Date: ____________ Start time: ____________ Stop time: ____________ Movements: ____________ Date: ____________ Start time: ____________ Stop time: ____________ Movements: ____________ This information is not intended to replace advice given to you by your health care provider. Make sure you discuss any questions you have with your health care provider. Document Revised: 08/04/2019 Document Reviewed: 08/04/2019 Elsevier Patient Education  2020 [employer] Braxton Hicks Contractions Contractions of the uterus can occur throughout pregnancy, but they are not always a sign that you are in labor. You may have practice contractions called Braxton Hicks contractions. These false labor contractions are sometimes confused with true labor. What are Braxton Hicks contractions? Braxton Hicks contractions are tightening movements that occur in the muscles of the uterus before labor. Unlike true labor contractions, these contractions do not result in opening (dilation) and thinning of the cervix. Toward the end of pregnancy (32-34 weeks), Braxton Hicks contractions can happen more often and may become stronger. These contractions are sometimes difficult to tell apart from true labor because they can be very uncomfortable. You should not feel embarrassed if you go to the hospital with false labor. Sometimes, the only way to tell if you are in true labor is for your health care provider to look for changes in the cervix. The health care provider   will do a physical exam and may  monitor your contractions. If you are not in true labor, the exam should show that your cervix is not dilating and your water has not broken. If there are no other health problems associated with your pregnancy, it is completely safe for you to be sent home with false labor. You may continue to have Braxton Hicks contractions until you go into true labor. How to tell the difference between true labor and false labor True labor  Contractions last 30-70 seconds.  Contractions become very regular.  Discomfort is usually felt in the top of the uterus, and it spreads to the lower abdomen and low back.  Contractions do not go away with walking.  Contractions usually become more intense and increase in frequency.  The cervix dilates and gets thinner. False labor  Contractions are usually shorter and not as strong as true labor contractions.  Contractions are usually irregular.  Contractions are often felt in the front of the lower abdomen and in the groin.  Contractions may go away when you walk around or change positions while lying down.  Contractions get weaker and are shorter-lasting as time goes on.  The cervix usually does not dilate or become thin. Follow these instructions at home:   Take over-the-counter and prescription medicines only as told by your health care provider.  Keep up with your usual exercises and follow other instructions from your health care provider.  Eat and drink lightly if you think you are going into labor.  If Braxton Hicks contractions are making you uncomfortable: ? Change your position from lying down or resting to walking, or change from walking to resting. ? Sit and rest in a tub of warm water. ? Drink enough fluid to keep your urine pale yellow. Dehydration may cause these contractions. ? Do slow and deep breathing several times an hour.  Keep all follow-up prenatal visits as told by your health care provider. This is important. Contact a  health care provider if:  You have a fever.  You have continuous pain in your abdomen. Get help right away if:  Your contractions become stronger, more regular, and closer together.  You have fluid leaking or gushing from your vagina.  You pass blood-tinged mucus (bloody show).  You have bleeding from your vagina.  You have low back pain that you never had before.  You feel your baby's head pushing down and causing pelvic pressure.  Your baby is not moving inside you as much as it used to. Summary  Contractions that occur before labor are called Braxton Hicks contractions, false labor, or practice contractions.  Braxton Hicks contractions are usually shorter, weaker, farther apart, and less regular than true labor contractions. True labor contractions usually become progressively stronger and regular, and they become more frequent.  Manage discomfort from Braxton Hicks contractions by changing position, resting in a warm bath, drinking plenty of water, or practicing deep breathing. This information is not intended to replace advice given to you by your health care provider. Make sure you discuss any questions you have with your health care provider. Document Revised: 11/27/2017 Document Reviewed: 04/30/2017 Elsevier Patient Education  2020 [employer]  

## 2020-07-19 NOTE — Progress Notes (Signed)
Patient reports higher than normal BP for her last week of 130/75 with repeat 126/77

## 2020-07-23 LAB — GC/CHLAMYDIA PROBE AMP (~~LOC~~) NOT AT ARMC
Chlamydia: NEGATIVE
Comment: NEGATIVE
Comment: NORMAL
Neisseria Gonorrhea: NEGATIVE

## 2020-07-23 LAB — CULTURE, BETA STREP (GROUP B ONLY): Strep Gp B Culture: NEGATIVE

## 2020-07-26 ENCOUNTER — Encounter (INDEPENDENT_AMBULATORY_CARE_PROVIDER_SITE_OTHER): Payer: Medicaid Other | Admitting: Obstetrics and Gynecology

## 2020-07-26 ENCOUNTER — Other Ambulatory Visit: Payer: Self-pay

## 2020-07-26 NOTE — Progress Notes (Signed)
Per chart review; pt had initial prenatal visit with [facility] today. Called pt; pt states she has successfully transferred care. Pt encouraged to follow up as needed with our office.   Fleet Contras RN 07/26/20   Chart reviewed for nurse visit. Agree with plan of care.   Duane Lope, NP 07/26/2020 1:56 PM

## 2020-08-09 ENCOUNTER — Inpatient Hospital Stay (HOSPITAL_COMMUNITY): Admission: AD | Admit: 2020-08-09 | Payer: Medicaid Other | Source: Home / Self Care

## 2020-10-19 IMAGING — US US TRANSVAGINAL NON-OB
1 series · 15 of 25 positions shown · non-contrast
Comparison: Ultrasound dated 01/15/2019

CLINICAL DATA: 22-year-old female with spontaneous abortion in
progress.

EXAM:
ULTRASOUND PELVIS TRANSVAGINAL
TECHNIQUE: Transvaginal ultrasound examination of the pelvis was performed
including evaluation of the uterus, ovaries, adnexal regions, and
pelvic cul-de-sac.

[Series 1: us transvaginal non-ob · 26 acquisitions, 15 frames shown]
[im 1/26]
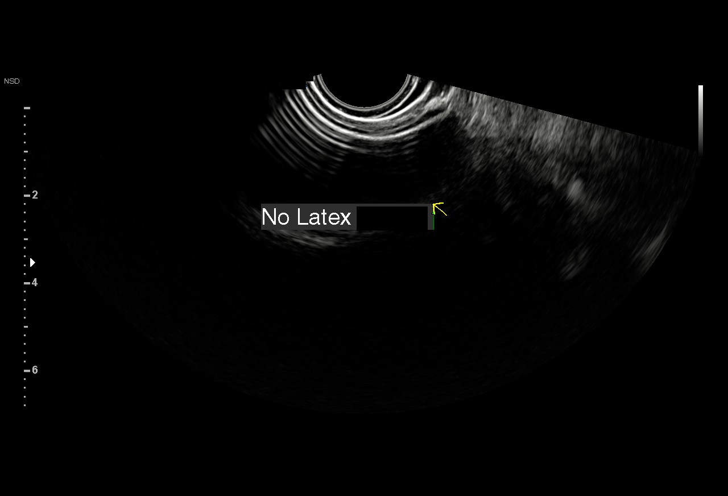
[im 3/26]
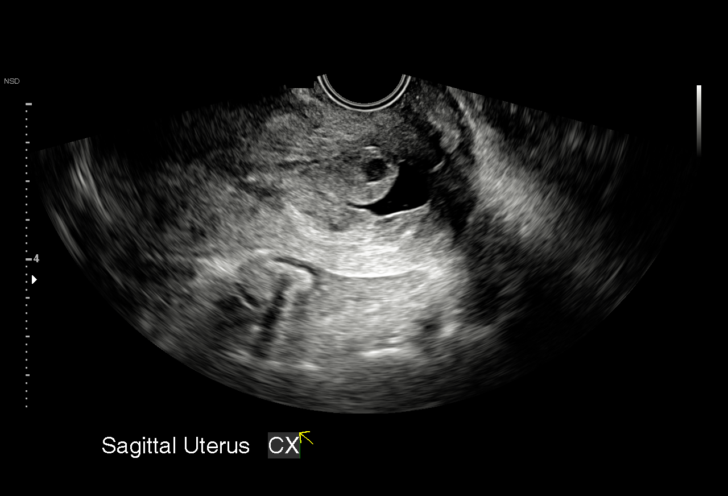
[im 5/26]
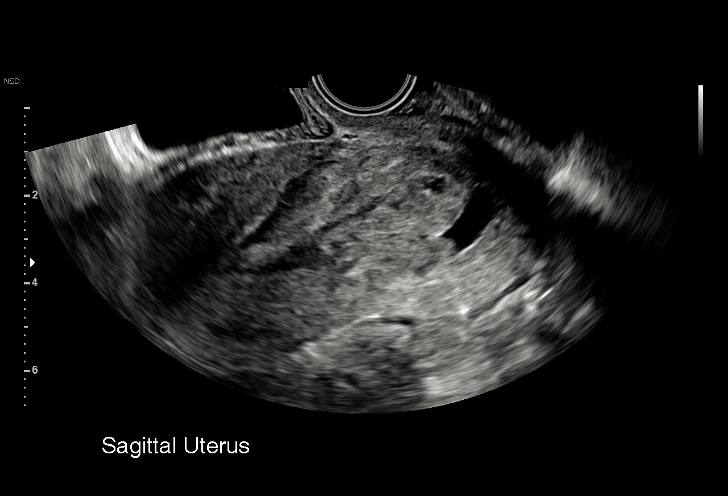
[im 6/26]
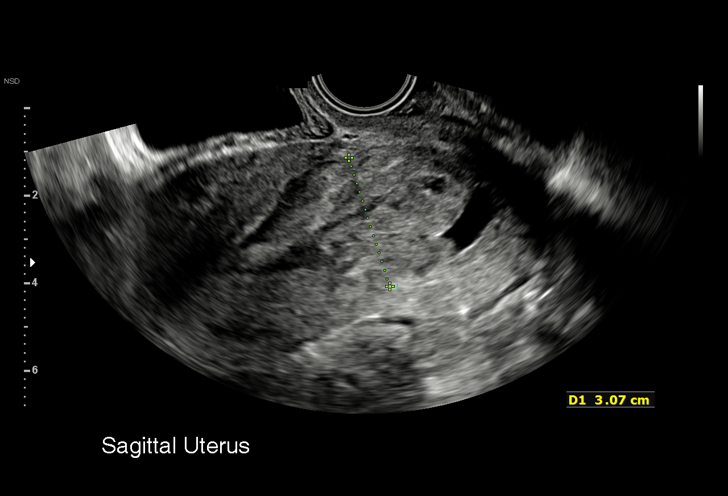
[im 8/26]
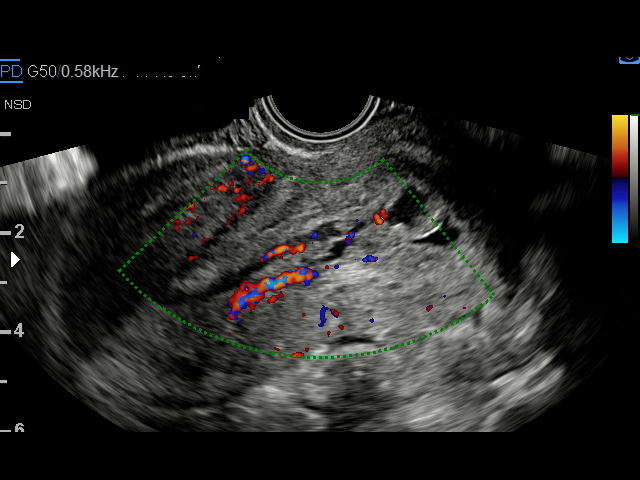
[im 10/26]
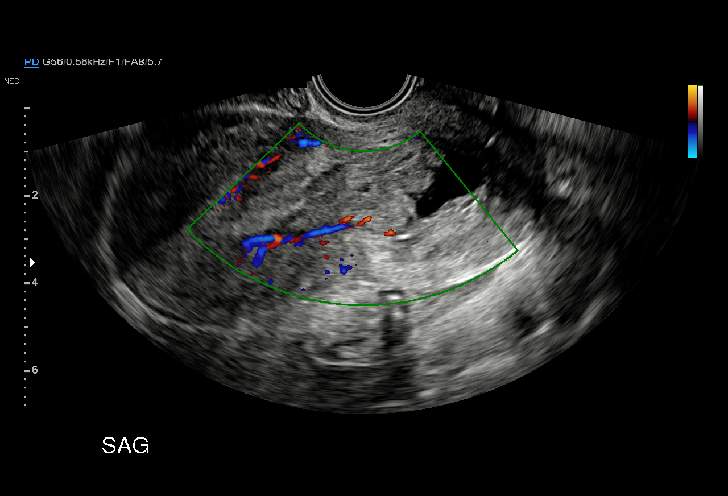
[im 11/26]
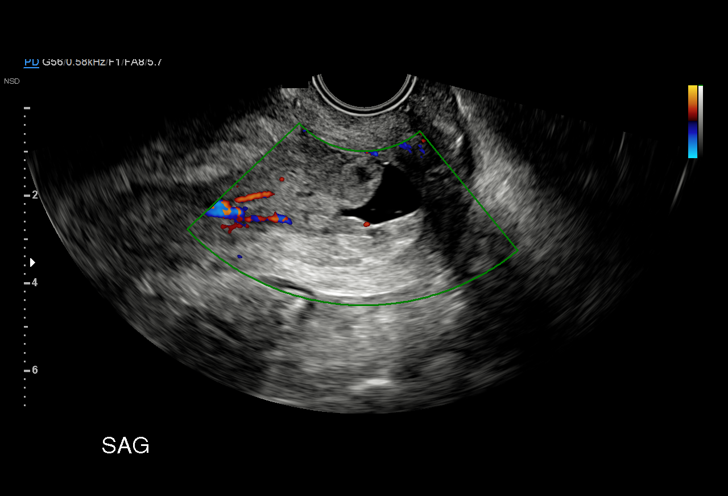
[im 13/26]
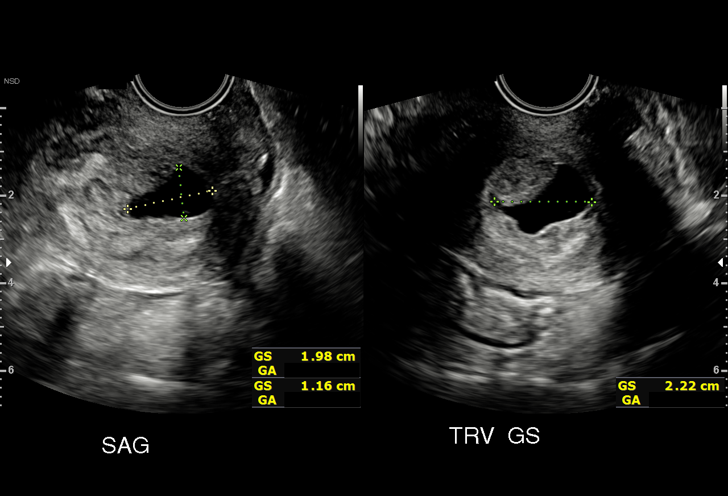
[im 15/26]
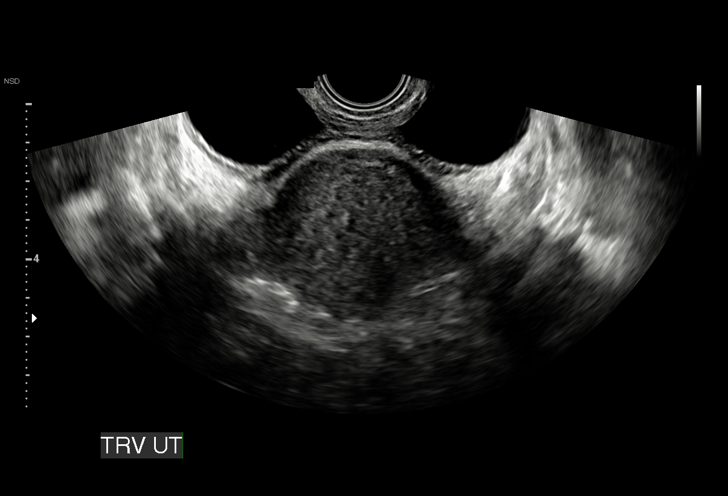
[im 16/26]
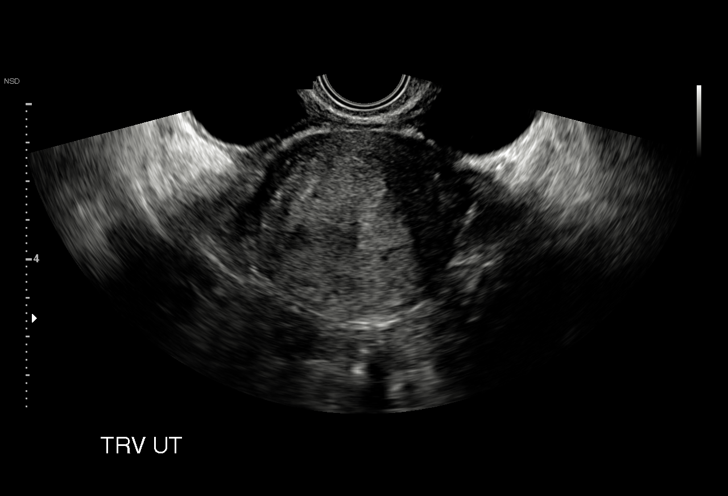
[im 18/26]
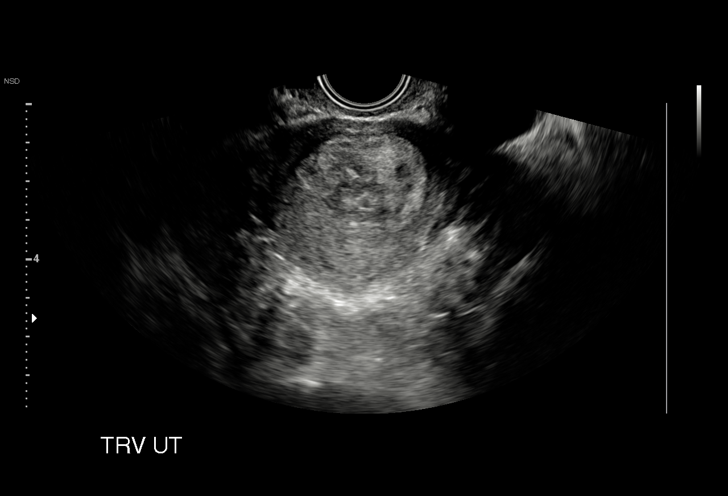
[im 20/26]
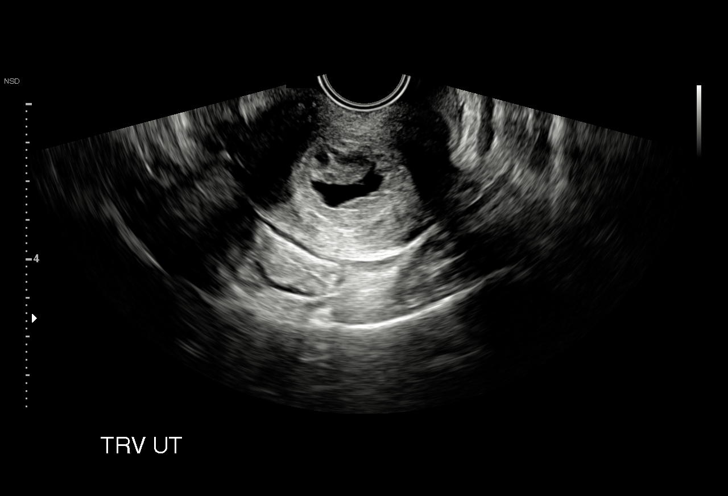
[im 21/26]
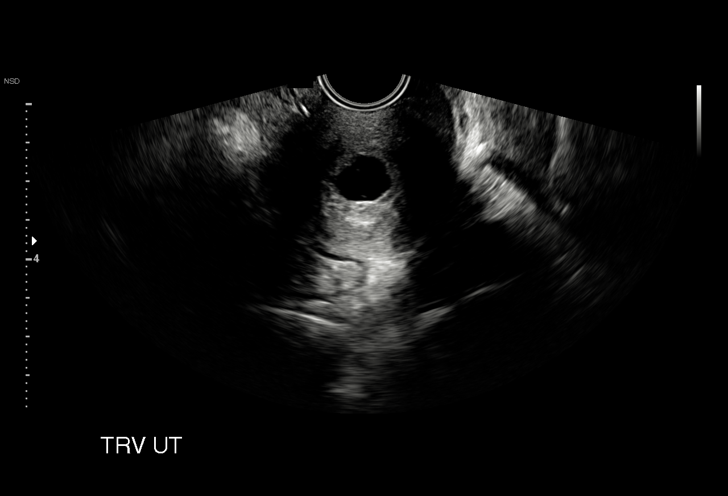
[im 23/26]
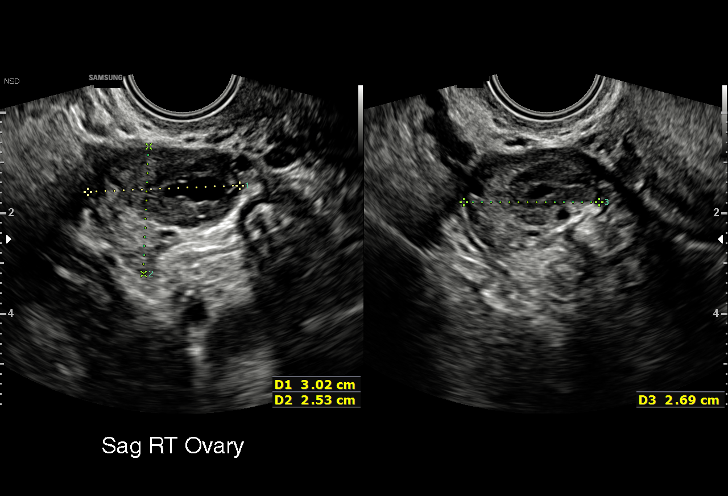
[im 26/26]
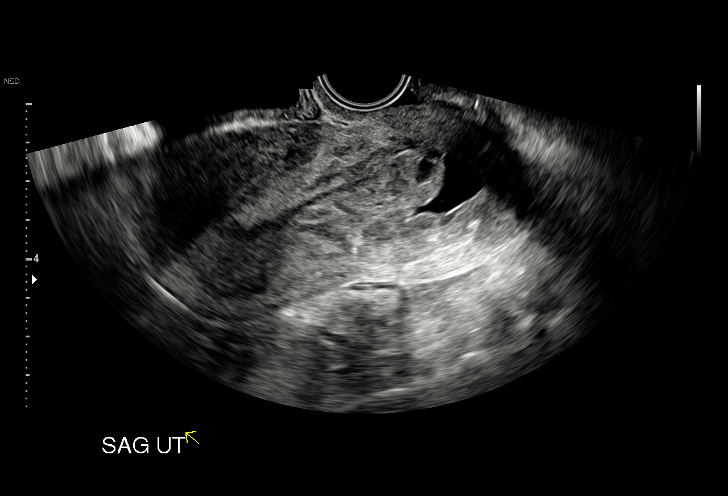

[15 of 25 positions shown; findings below may reference images not displayed]

FINDINGS: Uterus

Measurements: 9.5 x 4.7 x 6.1 cm = volume: 272 mL. The uterus is
anteverted.

Endometrium

Thickness: Thickened measuring approximately 31 mm. There is a 2.0 x
1.2 x 2.2 cm sac-like structure corresponding to the structure seen
on the prior ultrasound. This is now located in the lower
endometrium in the region of the cervix. There is complex tissue
with vascular flow within the endometrium superior to the sac-like
structure consistent with blood clot and products of conception.

Right ovary

Measurements: 3.0 x 2.5 x 2.7 cm = volume: 20 mL. Normal
appearance/no adnexal mass.

Left ovary

Measurements: Not visualized.

Other findings:  No abnormal free fluid
IMPRESSION: Previously seen sac-like structure is located in the lower
endometrium or endocervical region in keeping with miscarriage.
Complex tissue superior to the gestational sac represent combination
of blood clot and products of conception. Follow-up with HCG levels
and ultrasound recommended.

## 2020-11-04 IMAGING — US US OB TRANSVAGINAL
1 series · 15 of 28 positions shown · non-contrast
Comparison: 01/16/2019

CLINICAL DATA: Spontaneous abortion. Rule out retained products of
conception.

EXAM:
TRANSVAGINAL OB ULTRASOUND
TECHNIQUE: Transvaginal ultrasound was performed for complete evaluation of the
gestation as well as the maternal uterus, adnexal regions, and
pelvic cul-de-sac.

[Series 1: us ob transvaginal · 15 of 70 slices shown]
[im 1/70]
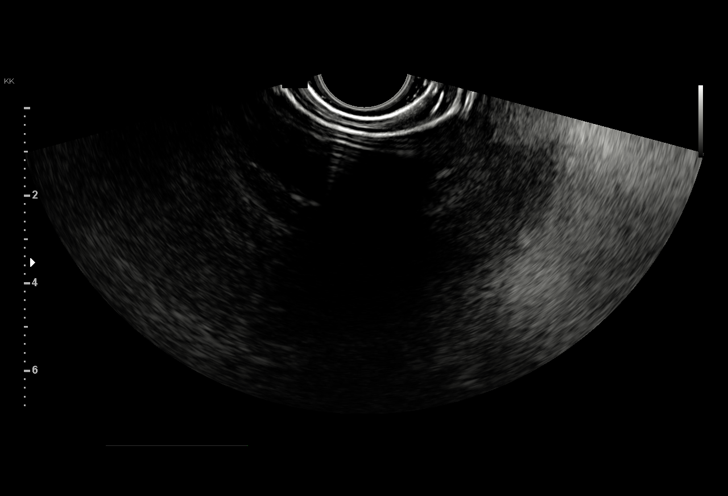
[im 6/70]
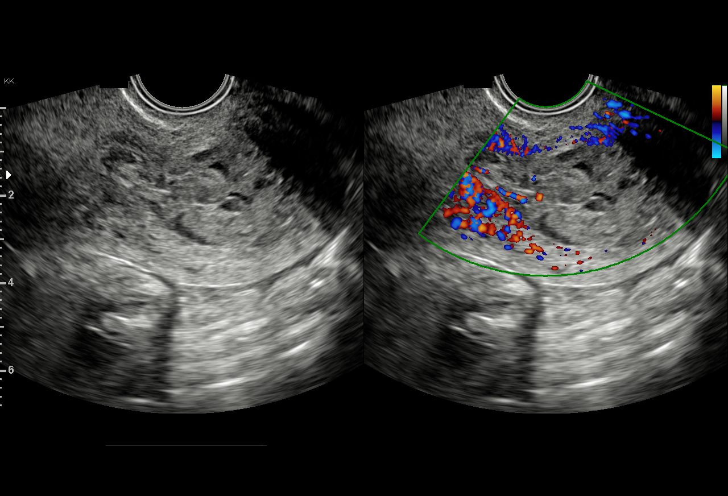
[im 11/70]
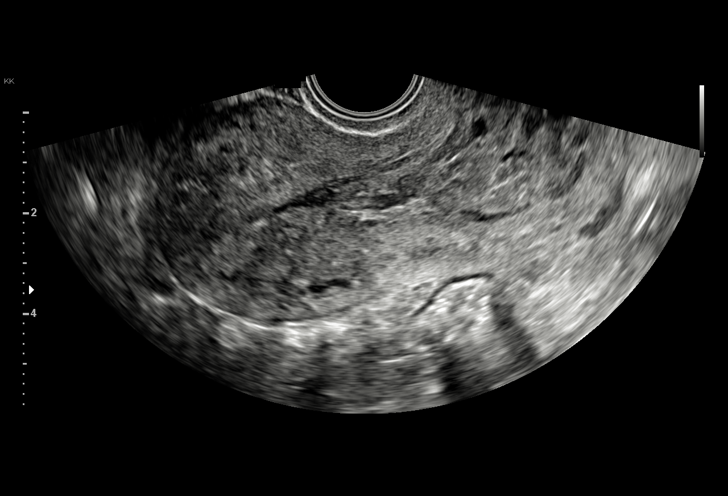
[im 16/70]
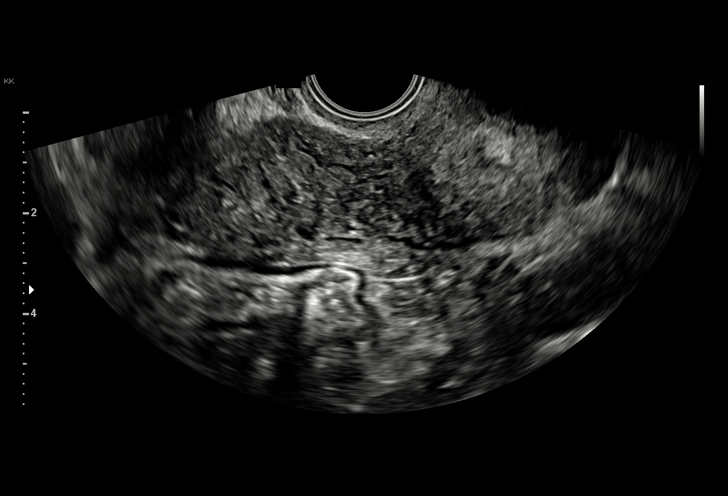
[im 21/70]
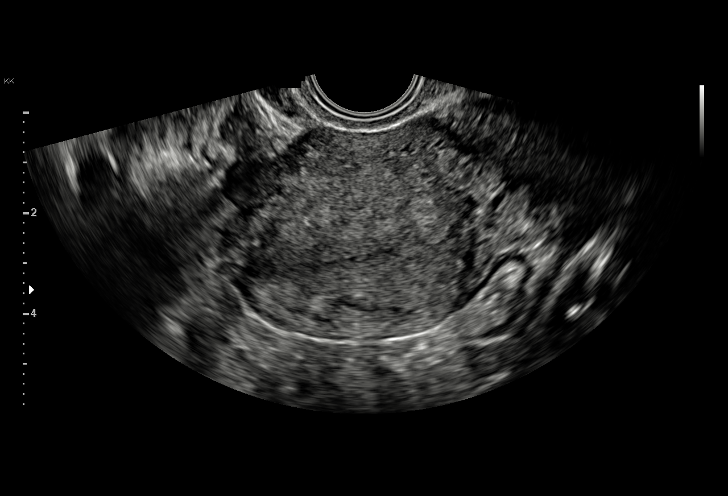
[im 26/70]
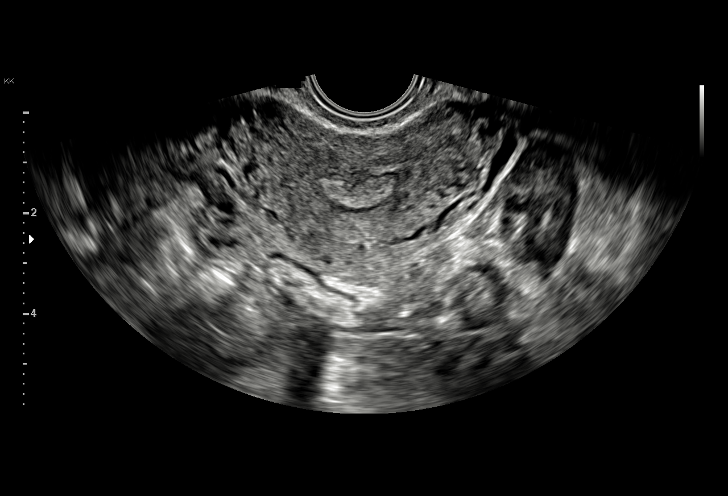
[im 31/70]
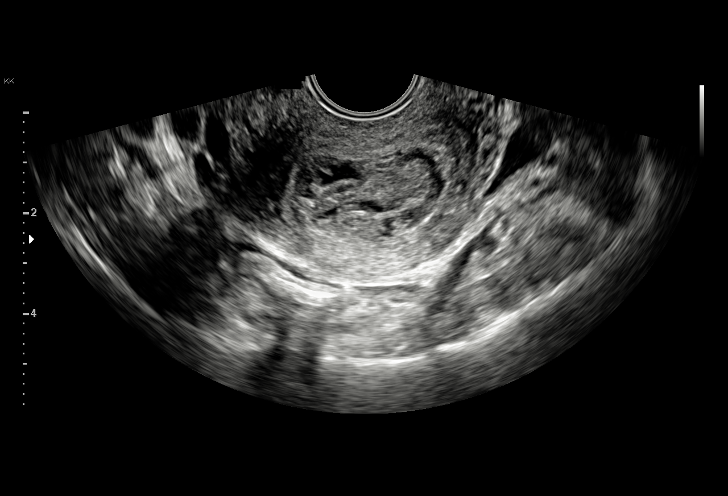
[im 36/70]
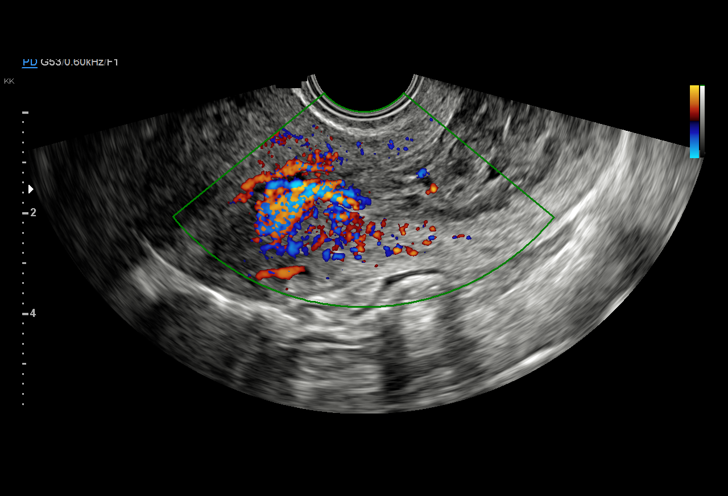
[im 39/70]
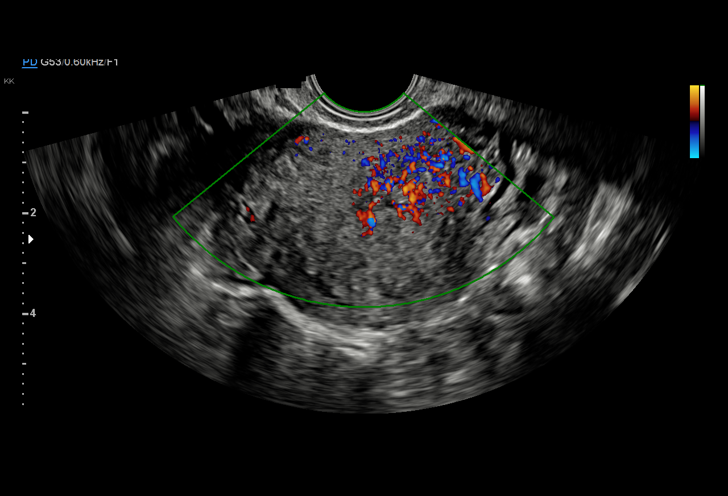
[im 44/70]
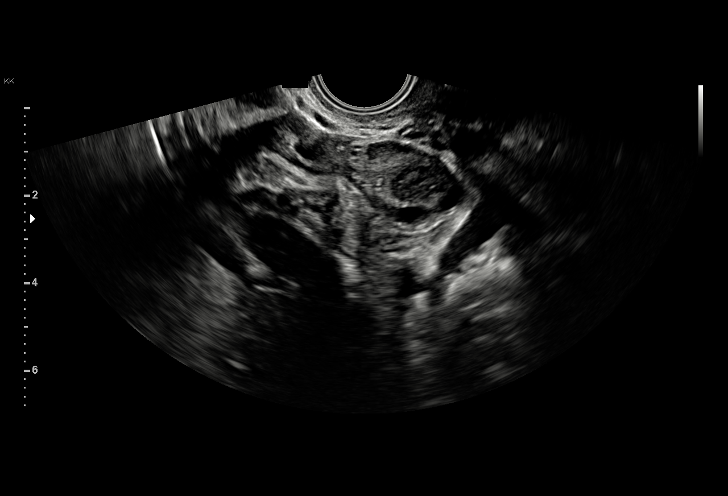
[im 49/70]
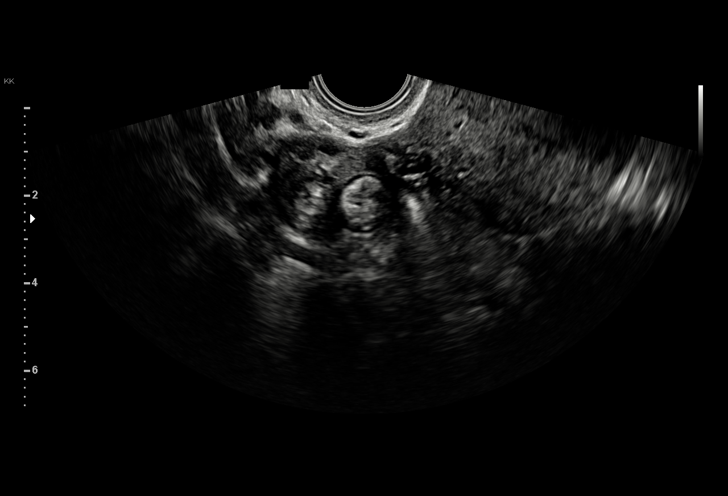
[im 54/70]
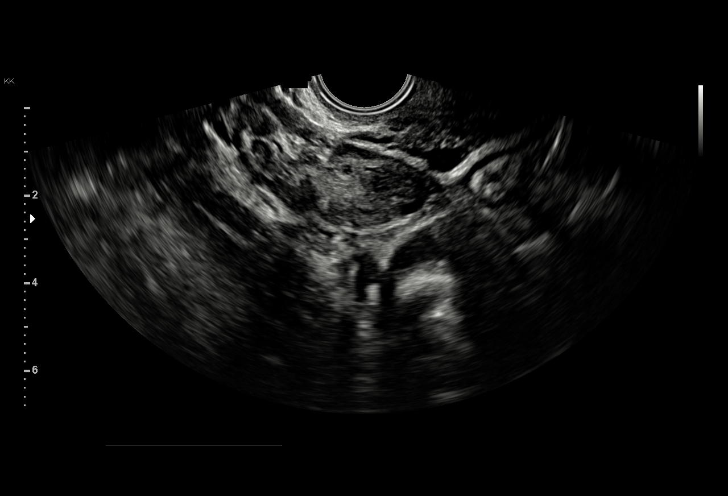
[im 59/70]
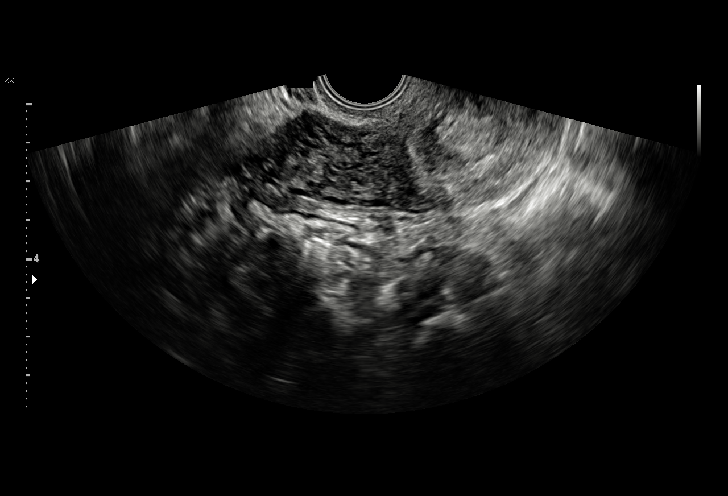
[im 64/70]
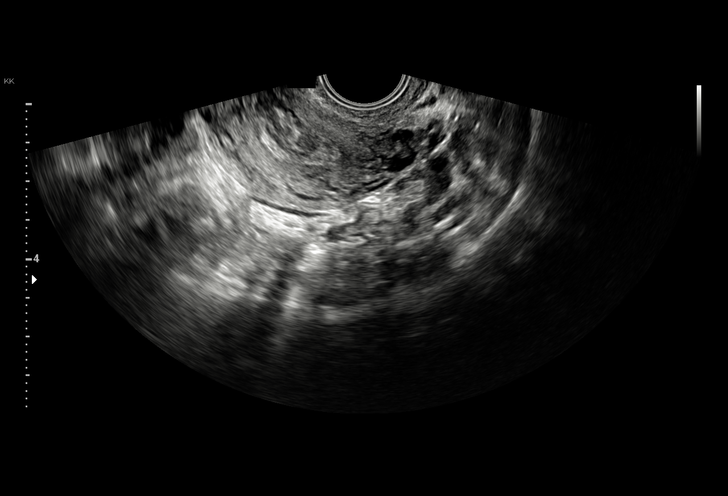
[im 70/70]
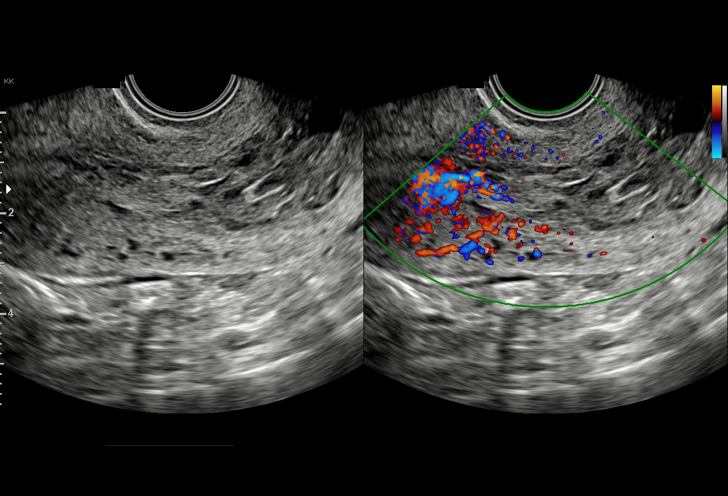

[15 of 28 positions shown; findings below may reference images not displayed]

FINDINGS: Intrauterine gestational sac: None

Yolk sac:  Not visualized

Embryo:  Not visualized

Cardiac Activity:

Heart Rate:  bpm

MSD:   mm    w     d

CRL:     mm    w  d                  US EDC:

Subchorionic hemorrhage:  None visualized.

Maternal uterus/adnexae: Heterogeneous material noted in the region
of the lower uterine segment and cervix contains blood flow
concerning for retained products of conception.
IMPRESSION: No intrauterine pregnancy. Heterogeneous soft tissue in the lower
uterine segment and cervical regions of the endometrium with
internal blood flow concerning for retained products of conception.

## 2021-12-16 IMAGING — US US MFM OB COMP +14 WKS
1 series · 13 of 28 positions shown · non-contrast
Comparison: none

[Series 1: us mfm ob comp +14 wks · 118 acquisitions, 13 frames shown]
[im 5/118]
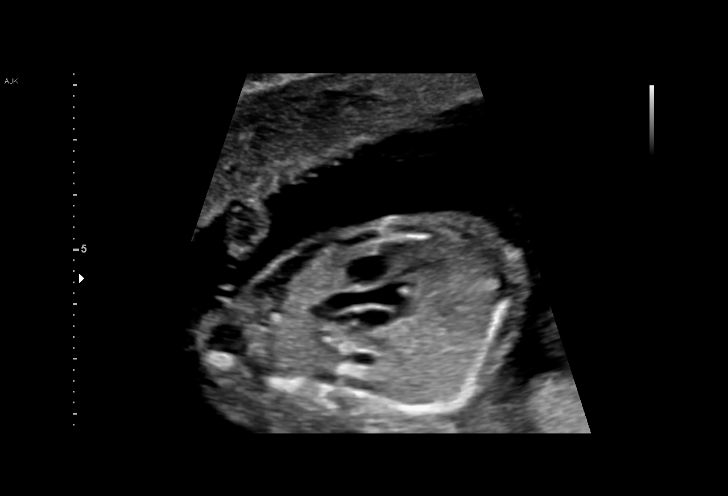
[im 14/118]
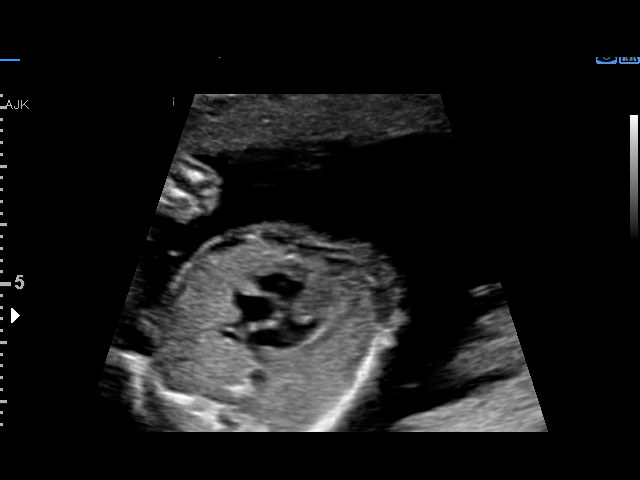
[im 22/118]
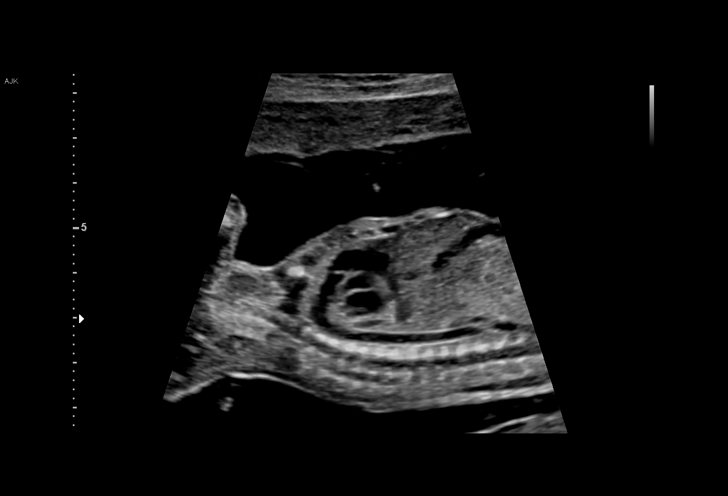
[im 31/118]
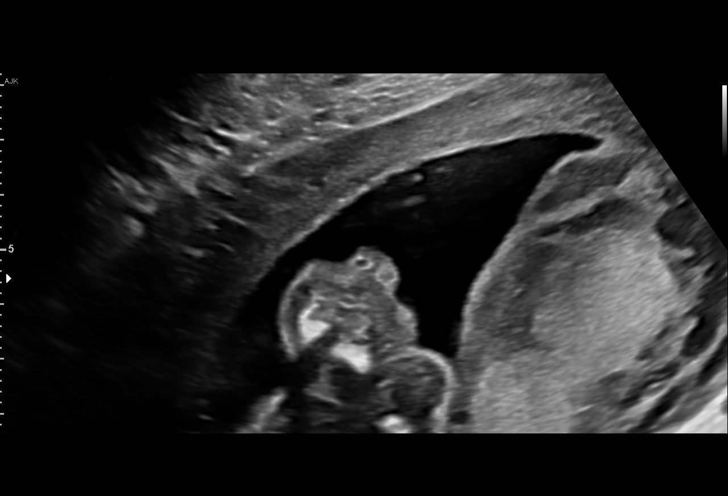
[im 40/118]
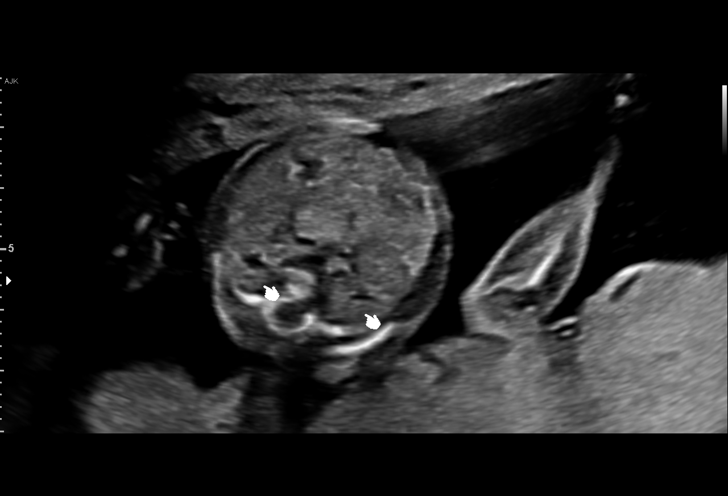
[im 48/118]
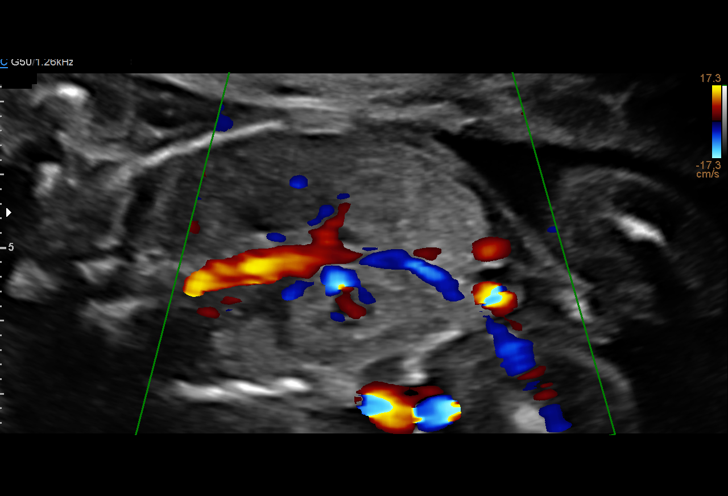
[im 61/118]
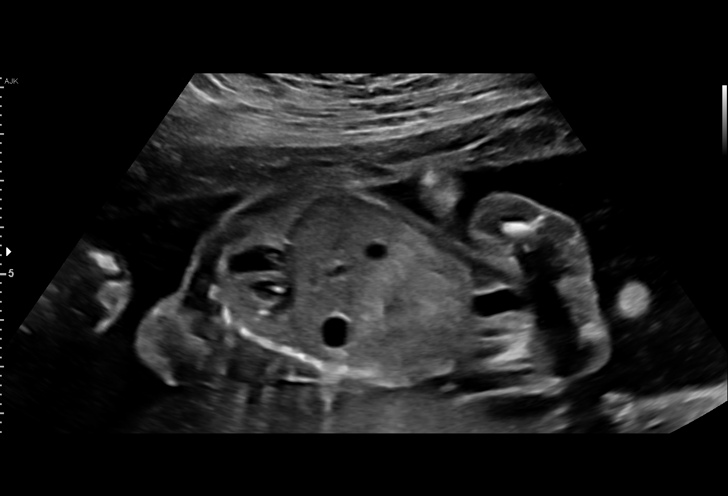
[im 70/118]
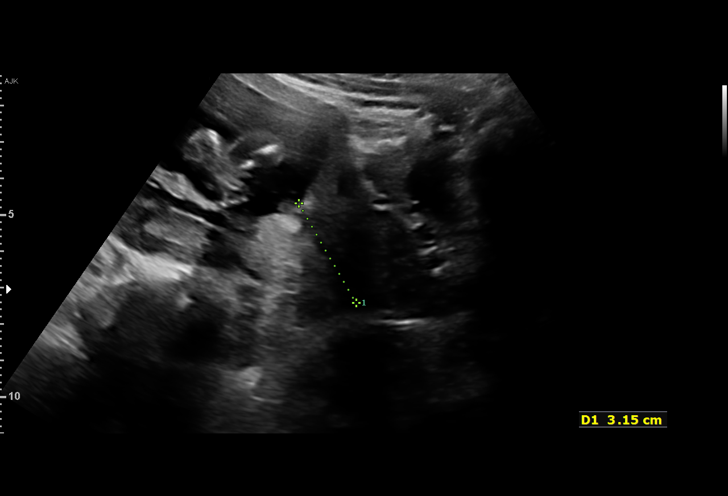
[im 79/118]
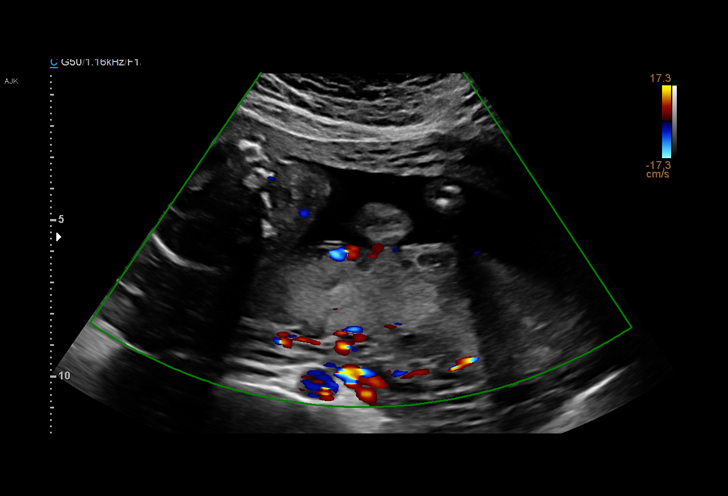
[im 87/118]
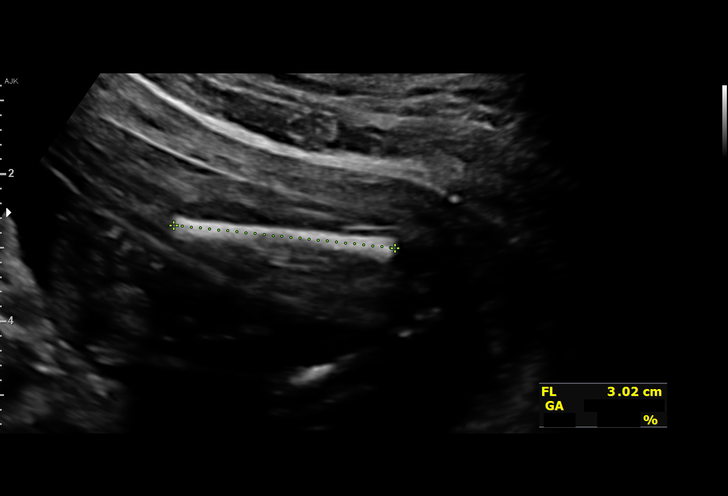
[im 96/118]
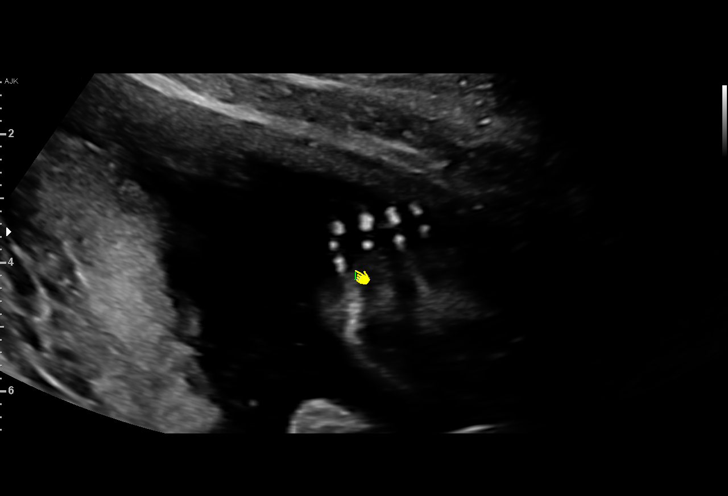
[im 105/118]
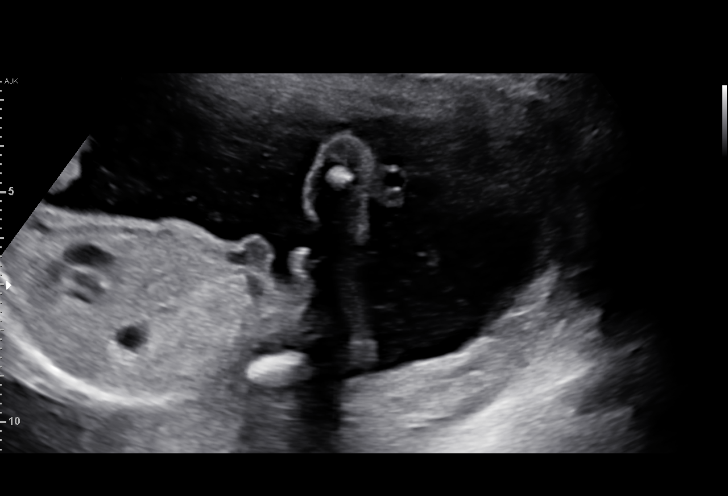
[im 113/118]
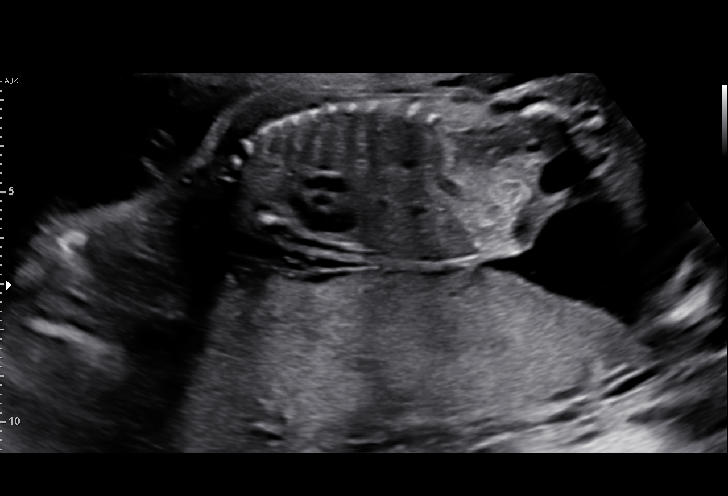

[13 of 28 positions shown; findings below may reference images not displayed]

JIM NP

  1  US MFM OB COMP + 14 WK               76805.01     [facility]
 ----------------------------------------------------------------------

 ----------------------------------------------------------------------
Indications

  Encounter for antenatal screening for
  malformations (low risk NIPs, neg horizon)
  18 weeks gestation of pregnancy
 ----------------------------------------------------------------------
Fetal Evaluation

 Num Of Fetuses:         1
 Cardiac Activity:       Observed
 Presentation:           Breech
 Placenta:               Posterior
 P. Cord Insertion:      Visualized, central

 Amniotic Fluid
 AFI FV:      Within normal limits

                             Largest Pocket(cm)

Biometry

 BPD:      44.5  mm     G. Age:  19w 3d         76  %    CI:        75.58   %    70 - 86
                                                         FL/HC:      18.5   %    16.1 -
 HC:      162.3  mm     G. Age:  19w 0d         50  %    HC/AC:      1.15        1.09 -
 AC:      141.4  mm     G. Age:  19w 4d         68  %    FL/BPD:     67.6   %
 FL:       30.1  mm     G. Age:  19w 2d         60  %    FL/AC:      21.3   %    20 - 24
 HUM:      30.5  mm     G. Age:  20w 1d         83  %
 CER:      19.7  mm     G. Age:  18w 6d         50  %
 NFT:       4.3  mm
 LV:        7.7  mm
 CM:        3.7  mm
 Est. FW:     289  gm    0 lb 10 oz      76  %
OB History

 Gravidity:    4          SAB:   1
 TOP:          2
Gestational Age

 LMP:           18w 6d        Date:  11/03/19                 EDD:   08/09/20
 U/S Today:     19w 2d                                        EDD:   08/06/20
 Best:          18w 6d     Det. By:  LMP  (11/03/19)          EDD:   08/09/20
Anatomy

 Cranium:               Appears normal         LVOT:                   Appears normal
 Cavum:                 Appears normal         Aortic Arch:            Appears normal
 Ventricles:            Appears normal         Ductal Arch:            Appears normal
 Choroid Plexus:        Appears normal         Diaphragm:              Appears normal
 Cerebellum:            Appears normal         Stomach:                Appears normal, left
                                                                       sided
 Posterior Fossa:       Appears normal         Abdomen:                Appears normal
 Nuchal Fold:           Appears normal         Abdominal Wall:         Appears nml (cord
                                                                       insert, abd wall)
 Face:                  Appears normal         Cord Vessels:           Appears normal (3
                        (orbits and profile)                           vessel cord)
 Lips:                  Appears normal         Kidneys:                Appear normal
 Palate:                Not well visualized    Bladder:                Appears normal
 Thoracic:              Appears normal         Spine:                  Appears normal
 Heart:                 Appears normal         Upper Extremities:      Appears normal
                        (4CH, axis, and
                        situs)
 RVOT:                  Appears normal         Lower Extremities:      Appears normal

 Other:  Fetus appears to be a male. Nasal bone visualized. Heels and RT 5th
         digit visualized.
Cervix Uterus Adnexa

 Cervix
 Length:           3.68  cm.
 Normal appearance by transabdominal scan.

 Uterus
 No abnormality visualized.

 Left Ovary
 Not visualized.

 Right Ovary
 Not visualized.

 Adnexa
 No abnormality visualized.
Comments

 This patient was seen for a detailed fetal anatomy scan.
 She denies any significant past medical history and denies
 any problems in her current pregnancy.
 She had a cell free DNA test earlier in her pregnancy which
 indicated a low risk for trisomy 21, 18, and 13. A male fetus is
 predicted.
 She was informed that the fetal growth and amniotic fluid
 level were appropriate for her gestational age.
 There were no obvious fetal anomalies noted on today's
 ultrasound exam.
 The patient was informed that anomalies may be missed due
 to technical limitations. If the fetus is in a suboptimal position
 or maternal habitus is increased, visualization of the fetus in
 the maternal uterus may be impaired.
 Follow up as indicated.
